# Patient Record
Sex: Female | Born: 1985
Health system: Southern US, Community
[De-identification: ages and names within clinical notes are randomized; demographics above are authoritative.]

## PROBLEM LIST (undated history)

## (undated) DIAGNOSIS — N611 Abscess of the breast and nipple: Secondary | ICD-10-CM

## (undated) DIAGNOSIS — T8332XA Displacement of intrauterine contraceptive device, initial encounter: Secondary | ICD-10-CM

## (undated) DIAGNOSIS — F4541 Pain disorder exclusively related to psychological factors: Secondary | ICD-10-CM

## (undated) DIAGNOSIS — O24419 Gestational diabetes mellitus in pregnancy, unspecified control: Secondary | ICD-10-CM

## (undated) DIAGNOSIS — U071 COVID-19: Secondary | ICD-10-CM

## (undated) HISTORY — DX: COVID-19: U07.1

## (undated) HISTORY — DX: Abscess of the breast and nipple: N61.1

## (undated) HISTORY — PX: NO PAST SURGERIES: SHX2092

## (undated) HISTORY — DX: Gestational diabetes mellitus in pregnancy, unspecified control: O24.419

---
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
## 2004-01-02 ENCOUNTER — Inpatient Hospital Stay (HOSPITAL_COMMUNITY): Admission: AD | Admit: 2004-01-02 | Discharge: 2004-01-04 | Payer: Self-pay | Admitting: Obstetrics

## 2009-02-26 ENCOUNTER — Ambulatory Visit (HOSPITAL_COMMUNITY): Admission: RE | Admit: 2009-02-26 | Discharge: 2009-02-26 | Payer: Self-pay | Admitting: Obstetrics & Gynecology

## 2009-03-12 ENCOUNTER — Ambulatory Visit (HOSPITAL_COMMUNITY): Admission: RE | Admit: 2009-03-12 | Discharge: 2009-03-12 | Payer: Self-pay | Admitting: Obstetrics & Gynecology

## 2009-04-20 ENCOUNTER — Ambulatory Visit: Payer: Self-pay | Admitting: Advanced Practice Midwife

## 2009-04-20 ENCOUNTER — Inpatient Hospital Stay (HOSPITAL_COMMUNITY): Admission: AD | Admit: 2009-04-20 | Discharge: 2009-04-20 | Payer: Self-pay | Admitting: Family Medicine

## 2009-04-20 ENCOUNTER — Encounter: Admission: RE | Admit: 2009-04-20 | Discharge: 2009-04-20 | Payer: Self-pay | Admitting: Obstetrics & Gynecology

## 2009-07-19 ENCOUNTER — Ambulatory Visit: Payer: Self-pay | Admitting: Obstetrics and Gynecology

## 2009-07-24 ENCOUNTER — Inpatient Hospital Stay (HOSPITAL_COMMUNITY): Admission: AD | Admit: 2009-07-24 | Discharge: 2009-07-26 | Payer: Self-pay | Admitting: Obstetrics and Gynecology

## 2009-07-24 ENCOUNTER — Ambulatory Visit: Payer: Self-pay | Admitting: Family Medicine

## 2010-09-04 LAB — RPR: RPR Ser Ql: NONREACTIVE

## 2010-09-04 LAB — CBC
HCT: 35.9 % — ABNORMAL LOW (ref 36.0–46.0)
Hemoglobin: 12.1 g/dL (ref 12.0–15.0)
MCHC: 33.5 g/dL (ref 30.0–36.0)
MCV: 89.1 fL (ref 78.0–100.0)
RBC: 4.03 MIL/uL (ref 3.87–5.11)
WBC: 8.1 10*3/uL (ref 4.0–10.5)

## 2012-12-04 ENCOUNTER — Encounter (HOSPITAL_COMMUNITY): Payer: Self-pay | Admitting: *Deleted

## 2012-12-04 ENCOUNTER — Emergency Department (HOSPITAL_COMMUNITY)
Admission: EM | Admit: 2012-12-04 | Discharge: 2012-12-04 | Disposition: A | Discharge status: Home / Self Care | Payer: Self-pay | Attending: Emergency Medicine | Admitting: Emergency Medicine

## 2012-12-04 DIAGNOSIS — L0291 Cutaneous abscess, unspecified: Secondary | ICD-10-CM

## 2012-12-04 DIAGNOSIS — IMO0002 Reserved for concepts with insufficient information to code with codable children: Secondary | ICD-10-CM | POA: Insufficient documentation

## 2012-12-04 DIAGNOSIS — I96 Gangrene, not elsewhere classified: Secondary | ICD-10-CM

## 2012-12-04 DIAGNOSIS — L988 Other specified disorders of the skin and subcutaneous tissue: Secondary | ICD-10-CM | POA: Insufficient documentation

## 2012-12-04 MED ORDER — SODIUM CHLORIDE 0.9 % IV BOLUS (SEPSIS)
1000.0000 mL | Freq: Once | INTRAVENOUS | Status: AC
  Administered 2012-12-04: 1000 mL via INTRAVENOUS

## 2012-12-04 MED ORDER — HYDROCODONE-ACETAMINOPHEN 5-325 MG PO TABS: 1.0000 | ORAL_TABLET | ORAL | Status: DC | PRN

## 2012-12-04 MED ORDER — CLINDAMYCIN PHOSPHATE 600 MG/50ML IV SOLN
600.0000 mg | Freq: Once | INTRAVENOUS | Status: AC
  Administered 2012-12-04: 600 mg via INTRAVENOUS
  Filled 2012-12-04: qty 50

## 2012-12-04 MED ORDER — ACETAMINOPHEN 500 MG PO TABS
1000.0000 mg | ORAL_TABLET | Freq: Once | ORAL | Status: AC
  Administered 2012-12-04: 1000 mg via ORAL
  Filled 2012-12-04: qty 2

## 2012-12-04 MED ORDER — CLINDAMYCIN HCL 150 MG PO CAPS: 150.0000 mg | ORAL_CAPSULE | Freq: Three times a day (TID) | ORAL | Status: DC

## 2012-12-04 NOTE — ED Provider Notes (Signed)
History     CSN: [id number]  Arrival date & time 12/04/12  1039   First MD Initiated Contact with Patient 12/04/12 1117      Chief Complaint  Patient presents with  . Abscess    (Consider location/radiation/quality/duration/timing/severity/associated sxs/prior treatment) The history is provided by the patient. The history is limited by a language barrier.    Sara Fowler is a 27 year old female.  She presents to the emergency department complaining of right forearm abscess.  Patient is predominantly Spanish speaking but is able to communicate well.  Her daughters provide translation more complicated questions.  Patient has a history of one prior abscess under her breast during pregnancy.  The patient states that she developed a bug bite that was itchy on her own warm.  She scratched in the next day she developed pain and swelling.  This is approximately 8 days ago.  He went to severe pain.  2 days ago the patient squeezed and burst the abscess and had significant pus and bloody drainage.  She denies any fevers, chills, nausea, vomiting, myalgias or arthralgias..  Does not know if she was bitten by anything.  She states that she has another area on her right lower that she is concerned this developing into an abscess.    History reviewed. No pertinent past medical history.  History reviewed. No pertinent past surgical history.  History reviewed. No pertinent family history.  History  Substance Use Topics  . Smoking status: Not on file  . Smokeless tobacco: Not on file  . Alcohol Use: No    OB History   Grav Para Term Preterm Abortions TAB SAB Ect Mult Living                  Review of Systems  Constitutional: Negative for fever and chills.  HENT: Negative for trouble swallowing.   Respiratory: Negative for shortness of breath.   Cardiovascular: Negative for chest pain.  Gastrointestinal: Negative for nausea, vomiting, abdominal pain, diarrhea and constipation.   Genitourinary: Negative for dysuria and hematuria.  Musculoskeletal: Negative for myalgias and arthralgias.  Skin: Positive for wound.  Neurological: Negative for numbness.  All other systems reviewed and are negative.     Allergies  Review of patient's allergies indicates no known allergies.  Home Medications   Current Outpatient Rx  Name  Route  Sig  Dispense  Refill  . acetaminophen (TYLENOL) 325 MG tablet   Oral   Take by mouth every 6 (six) hours as needed for pain.           BP 112/65  Pulse 66  Temp(Src) 98.5 F (36.9 C) (Oral)  Resp 20  SpO2 98%  Physical Exam  Constitutional: She is oriented to person, place, and time. She appears well-developed and well-nourished. No distress.  HENT:  Head: Normocephalic and atraumatic.  Eyes: Conjunctivae are normal. No scleral icterus.  Neck: Normal range of motion.  Cardiovascular: Normal rate, regular rhythm and normal heart sounds.  Exam reveals no gallop and no friction rub.   No murmur heard. Pulmonary/Chest: Effort normal and breath sounds normal. No respiratory distress.  Abdominal: Soft. Bowel sounds are normal. She exhibits no distension and no mass. There is no tenderness. There is no guarding.  Neurological: She is alert and oriented to person, place, and time.  Skin: Skin is warm and dry. She is not diaphoretic.  There is a 2 x 2 centimeter opened draining wound with serous discharge.  There is about 4 x  6 cm of surrounding and duration without lymphangitis.  It appears to be black tissue around the open lesion.  No pus noted.  There is an area of excoriation on the right calf.  There is about 4 cm of tender induration surrounding.new central fluctuance no lymphangitis.    ED Course  Procedures (including critical care time)  Labs Reviewed - No data to display No results found.   1. Abscess and cellulitis   2. Necrosis       MDM  12:07 PM patient with open draining wound.  Am concerned for  possible necrotic tissue surrounding.  I have asked for the patient to be seen by the attending physician.  She does not appear to be in any acute distress.  The signs of systemic infection the      12:36 PM patient seen in shared visit with Dr. Clarene Duke. WIll treat here with IV Clinda. No debridement. D/C with clinda, return to folllow up in 2 days for recheck  Arthor Captain, PA-C 12/04/12 1609

## 2012-12-04 NOTE — ED Notes (Signed)
Reports having abscess to right forearm x 6 days.

## 2012-12-07 NOTE — ED Provider Notes (Signed)
Medical screening examination/treatment/procedure(s) were performed by non-physician practitioner and as supervising physician I was immediately available for consultation/collaboration.   Laray Anger, DO 12/07/12 1816

## 2016-09-06 LAB — GLUCOSE, POCT (MANUAL RESULT ENTRY): POC Glucose: 98 mg/dl (ref 70–99)

## 2016-12-14 DIAGNOSIS — T8332XA Displacement of intrauterine contraceptive device, initial encounter: Secondary | ICD-10-CM

## 2016-12-14 HISTORY — DX: Displacement of intrauterine contraceptive device, initial encounter: T83.32XA

## 2016-12-26 ENCOUNTER — Ambulatory Visit (INDEPENDENT_AMBULATORY_CARE_PROVIDER_SITE_OTHER): Payer: Self-pay | Admitting: Obstetrics and Gynecology

## 2016-12-26 DIAGNOSIS — T8332XA Displacement of intrauterine contraceptive device, initial encounter: Secondary | ICD-10-CM | POA: Insufficient documentation

## 2016-12-26 DIAGNOSIS — T8332XD Displacement of intrauterine contraceptive device, subsequent encounter: Secondary | ICD-10-CM

## 2016-12-26 NOTE — Progress Notes (Signed)
Patient ID: Sara Fowler, female   DOB: 1986/04/06, 31 y.o.   MRN: [id number] [facility]. Pt had Mirena IUD placed back in 2011. Presented to HD in 2017 and IUD strings not seen and unable to locate and remove by NP and MD Was supposed to be scheduled for Dx hysteroscopy and removal but was not. U/S this past May confirmed IUD in proper place Pt is currently taking OCP's d/t expired IUD.   Consent obtained  GU, nl EGBUS, no IUD string noted, single tooth tenaculum placed on ant lip of cervix, Kelly clamped passed through os several times and unable to grasp IUD string. Monsel's solution applied for hemostasis. Pt tolerated well, no complications  A/P Retained IUD  [facility] Risks and post op care reviewed. Interrupter services used during today's visit.

## 2016-12-26 NOTE — Patient Instructions (Signed)
Histeroscopa (Hysteroscopy) La histeroscopa es un procedimiento que se utiliza para observar el interior de la matriz (tero) Puede indicarse por diferentes motivos, entre ellos:  Para evaluar una hemorragia anormal, un fibroma (tumor benigno, no canceroso), tumores, plipos o tejido cicatrizal (adherencias) y la posibilidad de cncer de tero.  Para detectar bultos (tumores) y otros crecimientos anormales uterinos.  Para buscar las causas por las que una mujer no queda embarazada (infertilidad) causas recurrentes de prdida de embarazo (abortos espontneos) o por la prdida de un dispositivo intrauterino (DIU).  Para realizar un procedimiento de esterilizacin cerrando las trompas de Falopio desde adentro del tero. En este procedimiento, se coloca un tubo delgado y luminoso, con una cmara en el extremo (histeroscopio)para observar el interior del tero. La histeroscopa se realiza inmediatamente despus del perodo menstrual para asegurarse de que no existe embarazo. INFORME A SU MDICO:  Cualquier alergia que tenga.  Todos los medicamentos que utiliza, incluyendo vitaminas, hierbas, gotas oftlmicas, cremas y medicamentos de venta libre.  Problemas previos que usted o los miembros de su familia hayan tenido con el uso de anestsicos.  Enfermedades de la sangre.   Cirugas previas.  Padecimientos mdicos.  RIESGOS Y COMPLICACIONES Generalmente es un procedimiento seguro. Sin embargo, como en cualquier procedimiento, pueden surgir complicaciones. Las complicaciones posibles son:  Perforacin del tero.  Sangrado excesivo.  Infeccin.  Lesin en el cuello del tero.  Lesiones en otros rganos.  Reaccin alrgica a un medicamento.  Inoculacin de lquido en exceso dentro del tero. ANTES DEL PROCEDIMIENTO  Consulte a su mdico si debe cambiar o suspender los medicamentos que toma habitualmente.  No tome aspirina ni anticoagulantes durante la semana previa al  procedimiento, o segn le hayan indicado. Pueden ocasionar hemorragias.  Si fuma, no lo haga durante las dos semanas anteriores al procedimiento.  En algunos casos, el da anterior al procedimiento se coloca un medicamento en el cuello del tero. Este medicamento dilata el cuello y agranda la abertura. Esto facilita la insercin del instrumento en el tero durante el procedimiento.  No debe comer ni beber nada durante al menos 8 horas antes de la ciruga.  Pdale a alguna persona que la lleve a su casa luego del procedimiento.  PROCEDIMIENTO  Le administrarn un medicamento para relajarse (sedante). Tambin podrn administrarle uno de los siguientes medicamentos: ? Medicamentos que adormecen el rea del cuello uterino (anestesia local). ? Un medicamento para que duerma durante el procedimiento (anestesia genera).  El histeroscopio se inserta a travs de la vagina, dentro del tero. La cmara del laparoscopio enva una imagen a una pantalla de televisin que se encuentra en el quirfano. De este modo, el mdico tendr una buena visin del interior del tero.  Durante el procedimiento le colocarn un lquido o aire en el interior de tero, lo que le permitir al cirujano observar mejor.  En algunas ocasiones, el tejido del interior del tero se legra suavemente. Esas muestras de tejido se envan al laboratorio para ser examinadas.  DESPUS DEL PROCEDIMIENTO  Si le han administrado anestesia general podr sentirse atontada durante algunas horas despus del procedimiento.  Si se utiliza un anestsico local, podr volver a su casa tan pronto como est estable y se sienta en condiciones.  Podr sentir clicos leves. Es normal que esto dure un par de das.  Podr tener una hemorragia, la que puede variar entre una pequea mancha durante algunos das hasta una hemorragia similar a una menstruacin durante 3-7 das. Esto es normal.  Si el resultado   de los estudios no estn listos durante la  visita, tenga otra entrevista con su mdico para conocerlos.  Esta informacin no tiene como fin reemplazar el consejo del mdico. Asegrese de hacerle al mdico cualquier pregunta que tenga. Document Released: 06/02/2005 Document Revised: 03/23/2013 Document Reviewed: 12/30/2012 Elsevier Interactive Patient Education  2017 Elsevier Inc.  

## 2016-12-30 ENCOUNTER — Encounter (HOSPITAL_COMMUNITY): Payer: Self-pay

## 2017-01-12 ENCOUNTER — Encounter (HOSPITAL_BASED_OUTPATIENT_CLINIC_OR_DEPARTMENT_OTHER): Payer: Self-pay | Admitting: *Deleted

## 2017-01-13 ENCOUNTER — Encounter (HOSPITAL_BASED_OUTPATIENT_CLINIC_OR_DEPARTMENT_OTHER)
Admission: RE | Admit: 2017-01-13 | Discharge: 2017-01-13 | Disposition: A | Discharge status: Home / Self Care | Payer: Self-pay | Source: Ambulatory Visit | Attending: Obstetrics and Gynecology | Admitting: Obstetrics and Gynecology

## 2017-01-13 DIAGNOSIS — Y848 Other medical procedures as the cause of abnormal reaction of the patient, or of later complication, without mention of misadventure at the time of the procedure: Secondary | ICD-10-CM | POA: Insufficient documentation

## 2017-01-13 DIAGNOSIS — T8389XA Other specified complication of genitourinary prosthetic devices, implants and grafts, initial encounter: Secondary | ICD-10-CM | POA: Insufficient documentation

## 2017-01-13 DIAGNOSIS — Z01818 Encounter for other preprocedural examination: Secondary | ICD-10-CM | POA: Insufficient documentation

## 2017-01-13 LAB — POCT PREGNANCY, URINE: PREG TEST UR: NEGATIVE

## 2017-01-13 LAB — CBC
HCT: 38.5 % (ref 36.0–46.0)
Hemoglobin: 12.9 g/dL (ref 12.0–15.0)
MCH: 29.8 pg (ref 26.0–34.0)
MCHC: 33.5 g/dL (ref 30.0–36.0)
MCV: 88.9 fL (ref 78.0–100.0)
Platelets: 264 10*3/uL (ref 150–400)
RBC: 4.33 MIL/uL (ref 3.87–5.11)
RDW: 13.2 % (ref 11.5–15.5)
WBC: 5.9 10*3/uL (ref 4.0–10.5)

## 2017-01-13 NOTE — Pre-Procedure Instructions (Signed)
Dorita will be interpreter for pt., per Judy at [facility]; please call [phone] if surgery time changes. 

## 2017-01-13 NOTE — H&P (Signed)
Sara Fowler is an 31 y.o.G3P3 female who presents for removal of IUD. She had a Mirena placed in 2011. Has had several attempts in the office to remove due to being expired but unable to do so. GYN U/S has confirmed IUD in proper place. Currently taking OCP's for contraception.   Menstrual History: Menarche age: [age] No LMP recorded. Patient is not currently having periods (Reason: IUD).    Past Medical History:  Diagnosis Date  . Malpositioned IUD 12/2016  . Stress headaches     Past Surgical History:  Procedure Laterality Date  . NO PAST SURGERIES      History reviewed. No pertinent family history.  Social History:  reports that she has never smoked. She has never used smokeless tobacco. She reports that she drinks alcohol. She reports that she does not use drugs.  Allergies: No Known Allergies  No prescriptions prior to admission.    Review of Systems  Constitutional: Negative.   Respiratory: Negative.   Cardiovascular: Negative.   Gastrointestinal: Negative.   Genitourinary: Negative.     Height 5\' 4"  (1.626 m), weight 217 lb (98.4 kg). Physical Exam  Constitutional: She appears well-developed and well-nourished.  Cardiovascular: Normal rate, regular rhythm and normal heart sounds.   Respiratory: Effort normal and breath sounds normal.  GI: Soft. Bowel sounds are normal.  Genitourinary:  Genitourinary Comments: Nl EGBUS, cervix no lesions, unable to visualize IUD strings, uterus small mobile no masses    Results for orders placed or performed during the hospital encounter of 01/13/17 (from the past 24 hour(s))  Pregnancy, urine POC     Status: None   Collection Time: 01/13/17 12:36 PM  Result Value Ref Range   Preg Test, Ur NEGATIVE NEGATIVE    No results found.  Assessment/Plan: Retained IUD  Pt is being admitted today for hysteroscopic removal of IUD. R/B and post op care have been reviewed.    Hermina StaggersMichael L Stanislaus Kaltenbach 01/13/2017, 2:28 PM

## 2017-01-13 NOTE — Progress Notes (Signed)
Pregnancy test negative

## 2017-01-14 ENCOUNTER — Ambulatory Visit (HOSPITAL_BASED_OUTPATIENT_CLINIC_OR_DEPARTMENT_OTHER): Payer: Self-pay | Admitting: Anesthesiology

## 2017-01-14 ENCOUNTER — Encounter (HOSPITAL_BASED_OUTPATIENT_CLINIC_OR_DEPARTMENT_OTHER)
Admission: RE | Disposition: A | Discharge status: Home / Self Care | Payer: Self-pay | Source: Ambulatory Visit | Attending: Obstetrics and Gynecology

## 2017-01-14 ENCOUNTER — Encounter (HOSPITAL_BASED_OUTPATIENT_CLINIC_OR_DEPARTMENT_OTHER): Payer: Self-pay | Admitting: *Deleted

## 2017-01-14 ENCOUNTER — Ambulatory Visit (HOSPITAL_BASED_OUTPATIENT_CLINIC_OR_DEPARTMENT_OTHER)
Admission: RE | Admit: 2017-01-14 | Discharge: 2017-01-14 | Disposition: A | Discharge status: Home / Self Care | Payer: Self-pay | Source: Ambulatory Visit | Attending: Obstetrics and Gynecology | Admitting: Obstetrics and Gynecology

## 2017-01-14 DIAGNOSIS — T8332XD Displacement of intrauterine contraceptive device, subsequent encounter: Secondary | ICD-10-CM

## 2017-01-14 DIAGNOSIS — Y762 Prosthetic and other implants, materials and accessory obstetric and gynecological devices associated with adverse incidents: Secondary | ICD-10-CM | POA: Insufficient documentation

## 2017-01-14 DIAGNOSIS — T8332XA Displacement of intrauterine contraceptive device, initial encounter: Secondary | ICD-10-CM | POA: Insufficient documentation

## 2017-01-14 DIAGNOSIS — Z30432 Encounter for removal of intrauterine contraceptive device: Secondary | ICD-10-CM

## 2017-01-14 DIAGNOSIS — N840 Polyp of corpus uteri: Secondary | ICD-10-CM | POA: Insufficient documentation

## 2017-01-14 HISTORY — DX: Displacement of intrauterine contraceptive device, initial encounter: T83.32XA

## 2017-01-14 HISTORY — DX: Pain disorder exclusively related to psychological factors: F45.41

## 2017-01-14 HISTORY — PX: HYSTEROSCOPY W/D&C: SHX1775

## 2017-01-14 SURGERY — DILATATION AND CURETTAGE /HYSTEROSCOPY
Anesthesia: General | Site: Vagina

## 2017-01-14 MED ORDER — SOD CITRATE-CITRIC ACID 500-334 MG/5ML PO SOLN
30.0000 mL | ORAL | Status: AC
  Administered 2017-01-14: 30 mL via ORAL

## 2017-01-14 MED ORDER — LACTATED RINGERS IV SOLN: INTRAVENOUS | Status: DC

## 2017-01-14 MED ORDER — DOXYCYCLINE HYCLATE 100 MG IV SOLR
100.0000 mg | INTRAVENOUS | Status: AC
  Administered 2017-01-14: 100 mg via INTRAVENOUS
  Filled 2017-01-14: qty 100

## 2017-01-14 MED ORDER — LIDOCAINE 2% (20 MG/ML) 5 ML SYRINGE
INTRAMUSCULAR | Status: DC | PRN
  Administered 2017-01-14: 60 mg via INTRAVENOUS

## 2017-01-14 MED ORDER — MIDAZOLAM HCL 2 MG/2ML IJ SOLN
1.0000 mg | INTRAMUSCULAR | Status: DC | PRN
  Administered 2017-01-14: 2 mg via INTRAVENOUS

## 2017-01-14 MED ORDER — KETOROLAC TROMETHAMINE 30 MG/ML IJ SOLN
INTRAMUSCULAR | Status: AC
  Filled 2017-01-14: qty 1

## 2017-01-14 MED ORDER — DEXAMETHASONE SODIUM PHOSPHATE 10 MG/ML IJ SOLN
INTRAMUSCULAR | Status: AC
  Filled 2017-01-14: qty 1

## 2017-01-14 MED ORDER — FERRIC SUBSULFATE 259 MG/GM EX SOLN
CUTANEOUS | Status: DC | PRN
  Administered 2017-01-14: 1 via TOPICAL

## 2017-01-14 MED ORDER — KETOROLAC TROMETHAMINE 30 MG/ML IJ SOLN
INTRAMUSCULAR | Status: DC | PRN
  Administered 2017-01-14: 30 mg via INTRAVENOUS

## 2017-01-14 MED ORDER — HYDROCODONE-ACETAMINOPHEN 5-325 MG PO TABS: 1.0000 | ORAL_TABLET | Freq: Four times a day (QID) | ORAL | 0 refills | Status: DC | PRN

## 2017-01-14 MED ORDER — DEXAMETHASONE SODIUM PHOSPHATE 4 MG/ML IJ SOLN
INTRAMUSCULAR | Status: DC | PRN
  Administered 2017-01-14: 10 mg via INTRAVENOUS

## 2017-01-14 MED ORDER — MIDAZOLAM HCL 2 MG/2ML IJ SOLN
INTRAMUSCULAR | Status: AC
  Filled 2017-01-14: qty 2

## 2017-01-14 MED ORDER — FENTANYL CITRATE (PF) 100 MCG/2ML IJ SOLN
INTRAMUSCULAR | Status: AC
  Filled 2017-01-14: qty 2

## 2017-01-14 MED ORDER — PROPOFOL 10 MG/ML IV BOLUS
INTRAVENOUS | Status: DC | PRN
  Administered 2017-01-14: 200 mg via INTRAVENOUS

## 2017-01-14 MED ORDER — PROPOFOL 10 MG/ML IV BOLUS
INTRAVENOUS | Status: AC
  Filled 2017-01-14: qty 20

## 2017-01-14 MED ORDER — BUPIVACAINE HCL 0.5 % IJ SOLN
INTRAMUSCULAR | Status: DC | PRN
  Administered 2017-01-14: 10 mL

## 2017-01-14 MED ORDER — LACTATED RINGERS IV SOLN
INTRAVENOUS | Status: DC
  Administered 2017-01-14: 13:00:00 via INTRAVENOUS

## 2017-01-14 MED ORDER — SCOPOLAMINE 1 MG/3DAYS TD PT72: 1.0000 | MEDICATED_PATCH | Freq: Once | TRANSDERMAL | Status: DC | PRN

## 2017-01-14 MED ORDER — ONDANSETRON HCL 4 MG/2ML IJ SOLN
INTRAMUSCULAR | Status: AC
  Filled 2017-01-14: qty 2

## 2017-01-14 MED ORDER — FERRIC SUBSULFATE 259 MG/GM EX SOLN
CUTANEOUS | Status: AC
  Filled 2017-01-14: qty 8

## 2017-01-14 MED ORDER — SOD CITRATE-CITRIC ACID 500-334 MG/5ML PO SOLN
ORAL | Status: AC
  Filled 2017-01-14: qty 15

## 2017-01-14 MED ORDER — LIDOCAINE 2% (20 MG/ML) 5 ML SYRINGE
INTRAMUSCULAR | Status: AC
  Filled 2017-01-14: qty 5

## 2017-01-14 MED ORDER — IBUPROFEN 800 MG PO TABS: 800.0000 mg | ORAL_TABLET | Freq: Three times a day (TID) | ORAL | 0 refills | Status: DC | PRN

## 2017-01-14 MED ORDER — HYDROMORPHONE HCL 1 MG/ML IJ SOLN: 0.2500 mg | INTRAMUSCULAR | Status: DC | PRN

## 2017-01-14 MED ORDER — FENTANYL CITRATE (PF) 100 MCG/2ML IJ SOLN
50.0000 ug | INTRAMUSCULAR | Status: DC | PRN
  Administered 2017-01-14: 100 ug via INTRAVENOUS

## 2017-01-14 SURGICAL SUPPLY — 16 items
CANISTER SUCT 3000ML PPV (MISCELLANEOUS) ×3 IMPLANT
CATH ROBINSON RED A/P 16FR (CATHETERS) ×3 IMPLANT
CLOTH BEACON ORANGE TIMEOUT ST (SAFETY) ×3 IMPLANT
CONTAINER PREFILL 10% NBF 60ML (FORM) ×6 IMPLANT
ELECT REM PT RETURN 9FT ADLT (ELECTROSURGICAL)
ELECTRODE REM PT RTRN 9FT ADLT (ELECTROSURGICAL) IMPLANT
GLOVE BIO SURGEON STRL SZ7.5 (GLOVE) ×3 IMPLANT
GLOVE BIOGEL PI IND STRL 7.0 (GLOVE) ×1 IMPLANT
GLOVE BIOGEL PI INDICATOR 7.0 (GLOVE) ×2
GOWN STRL REUS W/TWL LRG LVL3 (GOWN DISPOSABLE) ×3 IMPLANT
GOWN STRL REUS W/TWL XL LVL3 (GOWN DISPOSABLE) ×3 IMPLANT
PACK VAGINAL MINOR WOMEN LF (CUSTOM PROCEDURE TRAY) ×3 IMPLANT
PAD OB MATERNITY 4.3X12.25 (PERSONAL CARE ITEMS) ×3 IMPLANT
TOWEL OR 17X24 6PK STRL BLUE (TOWEL DISPOSABLE) ×6 IMPLANT
TUBING AQUILEX INFLOW (TUBING) ×3 IMPLANT
TUBING AQUILEX OUTFLOW (TUBING) ×3 IMPLANT

## 2017-01-14 NOTE — Interval H&P Note (Signed)
History and Physical Interval Note:  01/14/2017 12:32 PM  Sara Fowler  has presented today for surgery, with the diagnosis of MALPOSITIONED IUD  The various methods of treatment have been discussed with the patient and family. After consideration of risks, benefits and other options for treatment, the patient has consented to  Procedure(s): DILATATION AND CURETTAGE /HYSTEROSCOPY W/ IUD REMOVAL (N/A) as a surgical intervention .  The patient's history has been reviewed, patient examined, no change in status, stable for surgery.  I have reviewed the patient's chart and labs.  Questions were answered to the patient's satisfaction.     Hermina StaggersMichael L Ansley Mangiapane

## 2017-01-14 NOTE — Transfer of Care (Signed)
Immediate Anesthesia Transfer of Care Note  Patient: Sara Fowler  Procedure(s) Performed: Procedure(s): DILATATION AND CURETTAGE /HYSTEROSCOPY W/ IUD REMOVAL (N/A)  Patient Location: PACU  Anesthesia Type:General  Level of Consciousness: awake  Airway & Oxygen Therapy: Patient Spontanous Breathing and Patient connected to face mask oxygen  Post-op Assessment: Report given to RN and Post -op Vital signs reviewed and stable  Post vital signs: Reviewed and stable  Last Vitals:  Vitals:   01/14/17 1223  BP: 109/85  Pulse: 71  Resp: 18  Temp: 36.8 C    Last Pain:  Vitals:   01/14/17 1223  TempSrc: Oral  PainSc: 0-No pain      Patients Stated Pain Goal: 0 (01/14/17 1223)  Complications: No apparent anesthesia complications

## 2017-01-14 NOTE — Anesthesia Postprocedure Evaluation (Signed)
Anesthesia Post Note  Patient: Sara Fowler  Procedure(s) Performed: Procedure(s) (LRB): DILATATION AND CURETTAGE /HYSTEROSCOPY W/ IUD REMOVAL (N/A)     Patient location during evaluation: PACU Anesthesia Type: General Level of consciousness: awake and alert Pain management: pain level controlled Vital Signs Assessment: post-procedure vital signs reviewed and stable Respiratory status: spontaneous breathing, nonlabored ventilation and respiratory function stable Cardiovascular status: blood pressure returned to baseline and stable Postop Assessment: no signs of nausea or vomiting Anesthetic complications: no    Last Vitals:  Vitals:   01/14/17 1415 01/14/17 1435  BP: 111/79 119/77  Pulse: 60 (!) 57  Resp: 15 16  Temp:  36.8 C    Last Pain:  Vitals:   01/14/17 1435  TempSrc:   PainSc: 0-No pain                 Brea Coleson,W. EDMOND

## 2017-01-14 NOTE — Op Note (Signed)
Sara Fowler PROCEDURE DATE: 01/14/2017  PREOPERATIVE DIAGNOSIS:  Retained IUD POSTOPERATIVE DIAGNOSIS: The same PROCEDURE:     Dilation and Curettage and removal of IUD SURGEON:  Dr. Casimiro NeedleMichael L. Joni Norrod  INDICATIONS: 31 y.o. female with retained IUD. Unable to remove if office.  Risks of surgery were discussed with the patient including but not limited to: bleeding which may require transfusion; infection which may require antibiotics; injury to uterus or surrounding organs; need for additional procedures including laparotomy or laparoscopy; possibility of intrauterine scarring which may impair future fertility; and other postoperative/anesthesia complications. Written informed consent was obtained.    FINDINGS:  Normal appearing cervix, IUD noted to be sideways in endometrial cavity  ANESTHESIA:    Monitored intravenous sedation, paracervical block. INTRAVENOUS FLUIDS:  600 ml of LR ESTIMATED BLOOD LOSS:  Less than 20 ml. SPECIMENS:  Endometrial curettings COMPLICATIONS:  None immediate.  PROCEDURE DETAILS:  The patient received intravenous Doxycycline while in the preoperative area.  She was then taken to the operating room where monitored intravenous sedation was administered and was found to be adequate.  After an adequate timeout was performed, she was placed in the dorsal lithotomy position and examined; then prepped and draped in the sterile manner.   Her bladder was catheterized for an unmeasured amount of clear, yellow urine. A vaginal speculum was then placed in the patient's vagina and a single tooth tenaculum was applied to the anterior lip of the cervix.  A paracervical block using 10 ml of 0.5% Marcaine was administered. The cervix was gently dilated and polyp forceps was introduced into the endometrial cavity. The IUD was grasped and removed. Curettage was then performed.  There was minimal bleeding noted and the tenaculum was removed. Monsel's was applied to the cervix,with good  hemostasis noted.   All instruments were removed from the patient's vagina.  Sponge and instrument counts were correct times two  The patient tolerated the procedure well and was taken to the recovery area awake, and in stable condition.  The patient will be discharged to home as per PACU criteria.  Routine postoperative instructions given.  She was prescribed Percocet and Ibuprofen.  She will follow up in the clinic in 4 weeks for postoperative evaluation.   Latanga Nedrow L. Alysia PennaErvin, MD, FACOG Attending Obstetrician & Gynecologist Faculty Practice, [facility]

## 2017-01-14 NOTE — Anesthesia Preprocedure Evaluation (Addendum)
Anesthesia Evaluation  Patient identified by MRN, date of birth, ID band Patient awake    Reviewed: Allergy & Precautions, H&P , NPO status , Patient's Chart, lab work & pertinent test results  Airway Mallampati: III  TM Distance: >3 FB Neck ROM: Full    Dental no notable dental hx. (+) Teeth Intact, Dental Advisory Given   Pulmonary neg pulmonary ROS,    Pulmonary exam normal breath sounds clear to auscultation       Cardiovascular negative cardio ROS   Rhythm:Regular Rate:Normal     Neuro/Psych negative neurological ROS  negative psych ROS   GI/Hepatic negative GI ROS, Neg liver ROS,   Endo/Other  negative endocrine ROS  Renal/GU negative Renal ROS  negative genitourinary   Musculoskeletal   Abdominal   Peds  Hematology negative hematology ROS (+)   Anesthesia Other Findings   Reproductive/Obstetrics negative OB ROS                            Anesthesia Physical Anesthesia Plan  ASA: II  Anesthesia Plan: General   Post-op Pain Management:    Induction: Intravenous  PONV Risk Score and Plan: 3 and Ondansetron, Dexamethasone and Midazolam  Airway Management Planned: LMA  Additional Equipment:   Intra-op Plan:   Post-operative Plan: Extubation in OR  Informed Consent: I have reviewed the patients History and Physical, chart, labs and discussed the procedure including the risks, benefits and alternatives for the proposed anesthesia with the patient or authorized representative who has indicated his/her understanding and acceptance.     Dental advisory given  Plan Discussed with: CRNA  Anesthesia Plan Comments:         Anesthesia Quick Evaluation  

## 2017-01-14 NOTE — Discharge Instructions (Signed)
No Ibuprofen products until after 7pm today.  Post Anesthesia Home Care Instructions  Activity: Get plenty of rest for the remainder of the day. A responsible individual must stay with you for 24 hours following the procedure.  For the next 24 hours, DO NOT: -Drive a car -Advertising copywriterperate machinery -Drink alcoholic beverages -Take any medication unless instructed by your physician -Make any legal decisions or sign important papers.  Meals: Start with liquid foods such as gelatin or soup. Progress to regular foods as tolerated. Avoid greasy, spicy, heavy foods. If nausea and/or vomiting occur, drink only clear liquids until the nausea and/or vomiting subsides. Call your physician if vomiting continues.  Special Instructions/Symptoms: Your throat may feel dry or sore from the anesthesia or the breathing tube placed in your throat during surgery. If this causes discomfort, gargle with warm salt water. The discomfort should disappear within 24 hours.  If you had a scopolamine patch placed behind your ear for the management of post- operative nausea and/or vomiting:  1. The medication in the patch is effective for 72 hours, after which it should be removed.  Wrap patch in a tissue and discard in the trash. Wash hands thoroughly with soap and water. 2. You may remove the patch earlier than 72 hours if you experience unpleasant side effects which may include dry mouth, dizziness or visual disturbances. 3. Avoid touching the patch. Wash your hands with soap and water after contact with the patch.

## 2017-01-14 NOTE — Anesthesia Procedure Notes (Signed)
Procedure Name: LMA Insertion Date/Time: 01/14/2017 12:48 PM Performed by: Caren MacadamARTER, Javin Nong W Pre-anesthesia Checklist: Patient identified, Emergency Drugs available, Suction available and Patient being monitored Patient Re-evaluated:Patient Re-evaluated prior to induction Oxygen Delivery Method: Circle system utilized Preoxygenation: Pre-oxygenation with 100% oxygen Induction Type: IV induction Ventilation: Mask ventilation without difficulty LMA: LMA inserted LMA Size: 4.0 Number of attempts: 1 Airway Equipment and Method: Bite block Placement Confirmation: positive ETCO2 and breath sounds checked- equal and bilateral Tube secured with: Tape Dental Injury: Teeth and Oropharynx as per pre-operative assessment

## 2017-01-15 ENCOUNTER — Encounter (HOSPITAL_BASED_OUTPATIENT_CLINIC_OR_DEPARTMENT_OTHER): Payer: Self-pay | Admitting: Obstetrics and Gynecology

## 2017-02-17 ENCOUNTER — Ambulatory Visit: Payer: Self-pay | Admitting: Obstetrics and Gynecology

## 2017-02-17 ENCOUNTER — Encounter: Payer: Self-pay | Admitting: *Deleted

## 2017-02-17 NOTE — Progress Notes (Signed)
Sara Fowler did not keep her scheduled appointment for postop. Per discussion with Dr. Alysia PennaErvin- do not need to call the patient.  May reschedule if she calls.

## 2017-02-23 ENCOUNTER — Ambulatory Visit (INDEPENDENT_AMBULATORY_CARE_PROVIDER_SITE_OTHER): Payer: Self-pay | Admitting: Obstetrics and Gynecology

## 2017-02-23 ENCOUNTER — Encounter: Payer: Self-pay | Admitting: Obstetrics and Gynecology

## 2017-02-23 DIAGNOSIS — Z9889 Other specified postprocedural states: Secondary | ICD-10-CM | POA: Insufficient documentation

## 2017-02-23 NOTE — Progress Notes (Signed)
Pt here for post op follow up after removal of IUD. Doing well. No bowel or bladder dysfunction Has had a cycle since surgery Doing well with OCP's [facility]  PE AF VSS Lungs clear Heart RRR Abd soft + BS   A/P Post op follow up  Doing well. Continue with ADL's and OCP's. Continue GYN care with [facility] F/u with us PRN

## 2020-05-14 ENCOUNTER — Emergency Department (HOSPITAL_COMMUNITY)
Admission: EM | Admit: 2020-05-14 | Discharge: 2020-05-14 | Disposition: A | Discharge status: Home / Self Care | Payer: HRSA Program | Attending: Emergency Medicine | Admitting: Emergency Medicine

## 2020-05-14 ENCOUNTER — Encounter (HOSPITAL_COMMUNITY): Payer: Self-pay

## 2020-05-14 ENCOUNTER — Other Ambulatory Visit: Payer: Self-pay

## 2020-05-14 DIAGNOSIS — O98512 Other viral diseases complicating pregnancy, second trimester: Secondary | ICD-10-CM | POA: Diagnosis not present

## 2020-05-14 DIAGNOSIS — U071 COVID-19: Secondary | ICD-10-CM | POA: Diagnosis not present

## 2020-05-14 DIAGNOSIS — Z3A00 Weeks of gestation of pregnancy not specified: Secondary | ICD-10-CM | POA: Insufficient documentation

## 2020-05-14 DIAGNOSIS — Z3490 Encounter for supervision of normal pregnancy, unspecified, unspecified trimester: Secondary | ICD-10-CM

## 2020-05-14 LAB — RESP PANEL BY RT-PCR (FLU A&B, COVID) ARPGX2
Influenza A by PCR: NEGATIVE
Influenza B by PCR: NEGATIVE
SARS Coronavirus 2 by RT PCR: POSITIVE — AB

## 2020-05-14 NOTE — ED Triage Notes (Signed)
Spanish interpreter used for triage:  Pt reports 3 days of headache, body aches and fever. Fever as high as 100 at home. States she is 7 months pregnant.

## 2020-05-14 NOTE — ED Provider Notes (Signed)
[facility] EMERGENCY DEPARTMENT Provider Note   CSN: [id number] Arrival date & time: 05/14/20  1011     History Chief Complaint  Patient presents with  . Generalized Body Aches  . Fever    Sara Fowler is a 34 y.o. female who is currently 7 months pregnant (G4P3) to the ED today complaining of gradual onset, constant, diffuse, body aches for the past 3 days.  She also reports a fever with T-max 100.0 at home as well as headache.  She denies any recent sick contacts however is unvaccinated against COVID-19.  She states that she has been taking 325 mg Tylenol at home without relief prompting her to come to the ED today.  She states that no one else in her house is having symptoms.  She denies any cough, chest pain, shortness of breath, abdominal pain, nausea, vomiting, urinary symptoms, vaginal bleeding, vaginal discharge.   The history is provided by the patient and medical records. The history is limited by a language barrier. A language interpreter was used.       Past Medical History:  Diagnosis Date  . Malpositioned IUD 12/2016  . Stress headaches     Patient Active Problem List   Diagnosis Date Noted  . Post-operative state 02/23/2017    Past Surgical History:  Procedure Laterality Date  . HYSTEROSCOPY WITH D & C N/A 01/14/2017   Procedure: DILATATION AND CURETTAGE /HYSTEROSCOPY W/ IUD REMOVAL;  Surgeon: Hermina Staggers, MD;  Location: [facility];  Service: Gynecology;  Laterality: N/A;  . NO PAST SURGERIES       OB History    Gravida  1   Para      Term      Preterm      AB      Living        SAB      TAB      Ectopic      Multiple      Live Births              No family history on file.  Social History   Tobacco Use  . Smoking status: Never Smoker  . Smokeless tobacco: Never Used  Vaping Use  . Vaping Use: Never used  Substance Use Topics  . Alcohol use: Yes    Comment: occasionally  .  Drug use: No    Home Medications Prior to Admission medications   Medication Sig Start Date End Date Taking? Authorizing Provider  HYDROcodone-acetaminophen (NORCO/VICODIN) 5-325 MG tablet Take 1-2 tablets by mouth every 6 (six) hours as needed for moderate pain. Patient not taking: Reported on 02/23/2017 01/14/17   Hermina Staggers, MD  ibuprofen (ADVIL,MOTRIN) 800 MG tablet Take 1 tablet (800 mg total) by mouth every 8 (eight) hours as needed. Patient not taking: Reported on 02/23/2017 01/14/17   Hermina Staggers, MD  norethindrone (MICRONOR,CAMILA,ERRIN) 0.35 MG tablet Take 1 tablet by mouth daily.    [provider]    Allergies    Patient has no known allergies.  Review of Systems   Review of Systems  Constitutional: Positive for chills, fatigue and fever.  Respiratory: Negative for cough and shortness of breath.   Cardiovascular: Negative for chest pain.  Gastrointestinal: Negative for abdominal pain, nausea and vomiting.  Genitourinary: Negative for difficulty urinating, dysuria, flank pain and frequency.  Musculoskeletal: Positive for myalgias.  All other systems reviewed and are negative.   Physical Exam Updated Vital Signs  BP 103/77 (BP Location: Right Arm)   Pulse 95   Temp 98.2 F (36.8 C) (Oral)   Resp 18   Ht (P) 5\' 4"  (1.626 m)   Wt (P) 95.3 kg   SpO2 100%   BMI (P) 36.05 kg/m   Physical Exam Vitals and nursing note reviewed.  Constitutional:      General: She is not in acute distress.    Appearance: She is not ill-appearing or diaphoretic.  HENT:     Head: Normocephalic and atraumatic.  Eyes:     Conjunctiva/sclera: Conjunctivae normal.  Cardiovascular:     Rate and Rhythm: Normal rate and regular rhythm.     Pulses: Normal pulses.  Pulmonary:     Effort: Pulmonary effort is normal.     Breath sounds: Normal breath sounds. No wheezing, rhonchi or rales.     Comments: Speaking in full sentences without difficulty. Satting 100% on RA. LCTAB.    Abdominal:     Tenderness: There is no abdominal tenderness. There is no guarding or rebound.  Lymphadenopathy:     Cervical: No cervical adenopathy.  Skin:    General: Skin is warm and dry.     Coloration: Skin is not jaundiced.  Neurological:     Mental Status: She is alert.     ED Results / Procedures / Treatments   Labs (all labs ordered are listed, but only abnormal results are displayed) Labs Reviewed  RESP PANEL BY RT-PCR (FLU A&B, COVID) ARPGX2 - Abnormal; Notable for the following components:      Result Value   SARS Coronavirus 2 by RT PCR POSITIVE (*)    All other components within normal limits    EKG None  Radiology No results found.  Procedures Procedures (including critical care time)  Medications Ordered in ED Medications - No data to display  ED Course  I have reviewed the triage vital signs and the nursing notes.  Pertinent labs & imaging results that were available during my care of the patient were reviewed by me and considered in my medical decision making (see chart for details).  Clinical Course as of May 14 1328  Mon May 14, 2020  1233 SARS Coronavirus 2 by RT PCR(!): POSITIVE [MV]    Clinical Course User Index [MV] May 16, 2020, PA-C   MDM Rules/Calculators/A&P                          34 year old female who is currently 7 months pregnant, G4, P3, who presents to the ED today with complaint of Covid-like symptoms including headache, fevers with T-max 100.0, body aches.  She is unvaccinated.  Denies any recent sick contacts.  Has been taking 325 mg Tylenol without much relief.  On arrival to the ED vitals are stable.  Patient is afebrile, nontachycardic nontachypneic peers to be in no acute distress.  She is able to speak in full sentences without difficulty and satting 100% on room air.  Her lungs are clear to auscultation bilaterally.  She denies any abdominal pain, vomiting, urinary symptoms, vaginal bleeding, vaginal discharge.  No  chest pain or shortness of breath.  Covid test has been collected however still in process.  I did have a lengthy discussion using the interpretive services regarding monoclonal antibody infusion today given she is pregnant and qualifies however patient would like a couple of days to think about it.  I do feel she would benefit from this given she is pregnant  and obese.   Covid test has returned positive.  Patient instructed to self isolate for the next 14 days.  I will give her information to the infusion center for follow-up.  She does want a couple of days to think about it and is understanding that she has up until day 10 to receive the infusion.  She has been instructed on strict return precautions including worsening symptoms of shortness of breath, severe chest pain, abdominal pain, any urinary symptoms, any vaginal symptoms.  Patient is in agreement with plan is stable for discharge home.   This note was prepared using Dragon voice recognition software and may include unintentional dictation errors due to the inherent limitations of voice recognition software.  Final Clinical Impression(s) / ED Diagnoses Final diagnoses:  COVID-19  Pregnancy, unspecified gestational age    Rx / DC Orders ED Discharge Orders    None       Discharge Instructions     You have tested POSITIVE for COVID 19 today. You do qualify for monoclonal antibodies given you are currently pregnant. I have provided the Infusion Center with your information and they will call you to follow up if you are interested.   You will need to self isolate for 14 days starting today. Cleared: 12/14  Please call your OBGYN and let them know that you are currently COVID positive. Please follow up with them after you are done with your quarantine period  Return to the ED IMMEDIATELY for any worsening symptoms including chest pain, shortness of breath, coughing up blood, passing out, abdominal pain, vaginal bleeding/discharge, any  urinary symptoms, or any other new/concerning symptoms       Tanda Rockers, PA-C 05/14/20 1330    Alvira Monday, MD 05/18/20 [phone]

## 2020-05-14 NOTE — Discharge Instructions (Addendum)
You have tested POSITIVE for COVID 19 today. You do qualify for monoclonal antibodies given you are currently pregnant. I have provided the Infusion Center with your information and they will call you to follow up if you are interested.   You will need to self isolate for 14 days starting today. Cleared: 12/14  Please call your OBGYN and let them know that you are currently COVID positive. Please follow up with them after you are done with your quarantine period  Return to the ED IMMEDIATELY for any worsening symptoms including chest pain, shortness of breath, coughing up blood, passing out, abdominal pain, vaginal bleeding/discharge, any urinary symptoms, or any other new/concerning symptoms

## 2020-05-15 ENCOUNTER — Telehealth (HOSPITAL_COMMUNITY): Payer: Self-pay

## 2020-05-15 NOTE — Telephone Encounter (Signed)
Called to Discuss with patient about Covid symptoms and the use of the monoclonal antibody infusion for those with mild to moderate Covid symptoms and at a high risk of hospitalization.     Pt is qualified for this infusion due to co-morbid conditions and/or a member of an at-risk group.     Patient Active Problem List   Diagnosis Date Noted  . Post-operative state 02/23/2017    Patient declines infusion at this time. Symptoms tier reviewed as well as criteria for ending isolation. Preventative practices reviewed. Patient verbalized understanding.    Patient advised to call back if he/she decides that he/she does want to get infusion. Callback number to the infusion center given. Patient advised to go to Urgent care or ED with severe symptoms.   Patient mentioned she rather research about the medication and talk to her husband about it. Patient is also pregnant with sx onset 11/27. I informed patient that she has a day 10 window to call the [facility]AB hotline to schedule appointment.   Patient is spanish speaking and conversation was in spanish with patient. All questions answered.

## 2020-06-04 LAB — OB RESULTS CONSOLE ABO/RH: RH Type: POSITIVE

## 2020-06-04 LAB — OB RESULTS CONSOLE ANTIBODY SCREEN: Antibody Screen: NEGATIVE

## 2020-06-04 LAB — OB RESULTS CONSOLE GC/CHLAMYDIA
Chlamydia: NEGATIVE
Gonorrhea: POSITIVE

## 2020-06-04 LAB — GLUCOSE, 1 HOUR GESTATIONAL: Glucose, 1 Hour GTT: 150

## 2020-06-04 LAB — OB RESULTS CONSOLE RPR: RPR: NONREACTIVE

## 2020-06-04 LAB — HEPATITIS C ANTIBODY: HCV Ab: NEGATIVE

## 2020-06-04 LAB — OB RESULTS CONSOLE HGB/HCT, BLOOD
HCT: 33 (ref 29–41)
Hemoglobin: 10.9

## 2020-06-04 LAB — OB RESULTS CONSOLE RUBELLA ANTIBODY, IGM: Rubella: IMMUNE

## 2020-06-04 LAB — OB RESULTS CONSOLE HEPATITIS B SURFACE ANTIGEN: Hepatitis B Surface Ag: NEGATIVE

## 2020-06-04 LAB — OB RESULTS CONSOLE HIV ANTIBODY (ROUTINE TESTING): HIV: NONREACTIVE

## 2020-06-04 LAB — CYTOLOGY - PAP: Pap: NEGATIVE

## 2020-06-04 LAB — OB RESULTS CONSOLE PLATELET COUNT: Platelets: 448

## 2020-06-12 LAB — GLUCOSE TOLERANCE, 3 HOURS
Glucose 1 Hour: 164
Glucose 2 Hour: 154
Glucose 3 Hour: 83
Glucose Fasting: 97

## 2020-07-03 LAB — OB RESULTS CONSOLE GC/CHLAMYDIA
Chlamydia: NEGATIVE
Gonorrhea: NEGATIVE

## 2020-07-10 LAB — OB RESULTS CONSOLE HGB/HCT, BLOOD
HCT: 35 (ref 29–41)
Hemoglobin: 11.8

## 2020-07-10 LAB — GLUCOSE TOLERANCE, 3 HOURS
Glucose 1 Hour: 216
Glucose 2 Hour: 181
Glucose 3 Hour: 142
Glucose Fasting: 94

## 2020-07-10 LAB — OB RESULTS CONSOLE RPR: RPR: NONREACTIVE

## 2020-07-12 ENCOUNTER — Ambulatory Visit: Payer: Self-pay | Admitting: Registered"

## 2020-07-12 ENCOUNTER — Other Ambulatory Visit: Payer: Self-pay | Admitting: Family

## 2020-07-12 ENCOUNTER — Encounter: Payer: Self-pay | Admitting: Registered"

## 2020-07-12 ENCOUNTER — Encounter: Payer: Self-pay | Attending: Obstetrics & Gynecology | Admitting: Registered"

## 2020-07-12 ENCOUNTER — Other Ambulatory Visit: Payer: Self-pay

## 2020-07-12 DIAGNOSIS — Z8632 Personal history of gestational diabetes: Secondary | ICD-10-CM | POA: Insufficient documentation

## 2020-07-12 DIAGNOSIS — O24414 Gestational diabetes mellitus in pregnancy, insulin controlled: Secondary | ICD-10-CM | POA: Insufficient documentation

## 2020-07-12 DIAGNOSIS — Z3A00 Weeks of gestation of pregnancy not specified: Secondary | ICD-10-CM | POA: Insufficient documentation

## 2020-07-12 DIAGNOSIS — O24419 Gestational diabetes mellitus in pregnancy, unspecified control: Secondary | ICD-10-CM | POA: Insufficient documentation

## 2020-07-12 DIAGNOSIS — Z363 Encounter for antenatal screening for malformations: Secondary | ICD-10-CM

## 2020-07-12 NOTE — Progress Notes (Signed)
Interpreter services provided by Audelia Hives [phone] (first half, switched due to audio difficulty) Jacqulyn Bath [phone]  from [place]  Patient was seen on 07/12/20 for Gestational Diabetes self-management. EDD 10/09/20; [redacted]w[redacted]d Patient states no history of GDM. Diet history obtained. Patient has restricted her diet to mostly vegetables due to fear of blood sugar getting too high. Beverages include water, 8 oz juice not at one time, but throughout the day.   The following learning objectives were met by the patient :   States the definition of Gestational Diabetes  States why dietary management is important in controlling blood glucose  Describes the effects of carbohydrates on blood glucose levels  Demonstrates ability to create a balanced meal plan  Demonstrates carbohydrate counting   States when to check blood glucose levels  Demonstrates proper blood glucose monitoring techniques  States the effect of stress and exercise on blood glucose levels  States the importance of limiting caffeine and abstaining from alcohol and smoking  Plan:  Aim for 3 Carbohydrate Choices per meal (45 grams) +/- 1 either way  Aim for 1-2 Carbohydrate Choices per snack Begin reading food labels for Total Carbohydrate of foods If OK with your MD, consider  increasing your activity level by walking, Arm Chair Exercises or other activity daily as tolerated Begin checking Blood Glucose before breakfast and 2 hours after first bite of breakfast, lunch and dinner as directed by MD  Bring Log Book/Sheet and meter to every medical appointment  Baby Scripts: (BS 2.0 not capable of glucose management at this time.) Patient to record blood sugar on glucose log sheet  Take medication if directed by MD  Blood glucose monitor given: Prodigy Lot # [id number]: 95 mg/dL  Patient instructed to monitor glucose levels: FBS: 60 - 95 mg/dl 2 hour: <120 mg/dl  Patient received the following handouts:  Nutrition  Diabetes and Pregnancy  Carbohydrate Counting List  Blood glucose Log Sheet  Patient will be seen for follow-up in 1 weeks or as needed.

## 2020-07-16 ENCOUNTER — Other Ambulatory Visit: Payer: Self-pay | Admitting: *Deleted

## 2020-07-16 ENCOUNTER — Other Ambulatory Visit: Payer: Self-pay

## 2020-07-16 DIAGNOSIS — O24419 Gestational diabetes mellitus in pregnancy, unspecified control: Secondary | ICD-10-CM

## 2020-07-17 ENCOUNTER — Encounter: Payer: Self-pay | Admitting: *Deleted

## 2020-07-19 ENCOUNTER — Ambulatory Visit: Payer: Self-pay | Attending: Family

## 2020-07-19 ENCOUNTER — Ambulatory Visit: Payer: Self-pay | Admitting: *Deleted

## 2020-07-19 ENCOUNTER — Encounter: Payer: Self-pay | Admitting: *Deleted

## 2020-07-19 ENCOUNTER — Other Ambulatory Visit: Payer: Self-pay

## 2020-07-19 VITALS — BP 128/59 | HR 89

## 2020-07-19 DIAGNOSIS — O09523 Supervision of elderly multigravida, third trimester: Secondary | ICD-10-CM

## 2020-07-19 DIAGNOSIS — Z363 Encounter for antenatal screening for malformations: Secondary | ICD-10-CM | POA: Insufficient documentation

## 2020-07-20 ENCOUNTER — Other Ambulatory Visit: Payer: Self-pay | Admitting: *Deleted

## 2020-07-20 DIAGNOSIS — O24419 Gestational diabetes mellitus in pregnancy, unspecified control: Secondary | ICD-10-CM

## 2020-07-26 ENCOUNTER — Encounter: Payer: Self-pay | Admitting: Obstetrics and Gynecology

## 2020-07-31 ENCOUNTER — Encounter: Payer: Self-pay | Attending: Obstetrics & Gynecology | Admitting: Registered"

## 2020-07-31 ENCOUNTER — Ambulatory Visit (INDEPENDENT_AMBULATORY_CARE_PROVIDER_SITE_OTHER): Payer: Self-pay | Admitting: Family Medicine

## 2020-07-31 ENCOUNTER — Ambulatory Visit: Payer: Self-pay | Admitting: Registered"

## 2020-07-31 ENCOUNTER — Encounter: Payer: Self-pay | Admitting: Family Medicine

## 2020-07-31 ENCOUNTER — Other Ambulatory Visit: Payer: Self-pay

## 2020-07-31 VITALS — BP 120/75 | HR 96 | Wt 220.0 lb

## 2020-07-31 DIAGNOSIS — O24419 Gestational diabetes mellitus in pregnancy, unspecified control: Secondary | ICD-10-CM

## 2020-07-31 DIAGNOSIS — Z3A00 Weeks of gestation of pregnancy not specified: Secondary | ICD-10-CM | POA: Insufficient documentation

## 2020-07-31 DIAGNOSIS — O0990 Supervision of high risk pregnancy, unspecified, unspecified trimester: Secondary | ICD-10-CM

## 2020-07-31 DIAGNOSIS — O24410 Gestational diabetes mellitus in pregnancy, diet controlled: Secondary | ICD-10-CM

## 2020-07-31 NOTE — Patient Instructions (Signed)
 Eleccin del mtodo anticonceptivo Contraception Choices La anticoncepcin, o los mtodos anticonceptivos, hace referencia a los mtodos o dispositivos que evitan el embarazo. Mtodos hormonales Implante anticonceptivo Un implante anticonceptivo consiste en un tubo delgado de plstico que contiene una hormona que evita el embarazo. Es diferente de un dispositivo intrauterino (DIU). Un mdico lo inserta en la parte superior del brazo. Los implantes pueden ser eficaces durante un mximo de 3 aos. Inyecciones de progestina sola Las inyecciones de progestina sola contienen progestina, una forma sinttica de la hormona progesterona. Un mdico las administra cada 3 meses. Pldoras anticonceptivas Las pldoras anticonceptivas son pastillas que contienen hormonas que evitan el embarazo. Deben tomarse una vez al da, preferentemente a la misma hora cada da. Se necesita una receta para utilizar este mtodo anticonceptivo. Parche anticonceptivo El parche anticonceptivo contiene hormonas que evitan el embarazo. Se coloca en la piel, debe cambiarse una vez a la semana durante tres semanas y debe retirarse en la cuarta semana. Se necesita una receta para utilizar este mtodo anticonceptivo. Anillo vaginal Un anillo vaginal contiene hormonas que evitan el embarazo. Se coloca en la vagina durante tres semanas y se retira en la cuarta semana. Luego se repite el proceso con un anillo nuevo. Se necesita una receta para utilizar este mtodo anticonceptivo. Anticonceptivo de emergencia Los anticonceptivos de emergencia son mtodos para evitar un embarazo despus de tener sexo sin proteccin. Vienen en forma de pldora y pueden tomarse hasta 5 das despus de tener sexo. Funcionan mejor cuando se toman lo ms pronto posible luego de tener sexo. La mayora de los anticonceptivos de emergencia estn disponibles sin receta mdica. Este mtodo no debe utilizarse como el nico mtodo anticonceptivo.   Mtodos de  barrera Condn masculino Un condn masculino es una vaina delgada que se coloca sobre el pene durante el sexo. Los condones evitan que el esperma ingrese en el cuerpo de la mujer. Pueden utilizarse con un una sustancia que mata a los espermatozoides (espermicida) para aumentar la efectividad. Deben desecharse despus de un uso. Condn femenino Un condn femenino es una vaina blanda y holgada que se coloca en la vagina antes de tener sexo. El condn evita que el esperma ingrese en el cuerpo de la mujer. Deben desecharse despus de un uso. Diafragma Un diafragma es una barrera blanda con forma de cpula. Se inserta en la vagina antes del sexo, junto con un espermicida. El diafragma bloquea el ingreso de esperma en el tero, y el espermicida mata a los espermatozoides. El diafragma debe permanecer en la vagina durante 6 a 8 horas despus de tener sexo y debe retirarse en el plazo de las 24 horas. Un diafragma es recetado y colocado por un mdico. Debe reemplazarse cada 1 a 2 aos, despus de dar a luz, de aumentar ms de 15lb (6.8kg) y de una ciruga plvica. Capuchn cervical Un capuchn cervical es una copa redonda y blanda de ltex o plstico que se coloca en el cuello uterino. Se inserta en la vagina antes del sexo, junto con un espermicida. Bloquea el ingreso del esperma en el tero. El capuchn debe permanecer en el lugar durante 6 a 8 horas despus de tener sexo y debe retirarse en el plazo de las 48 horas. Un capuchn cervical debe ser recetado y colocado por un mdico. Debe reemplazarse cada 2aos. Esponja Una esponja es una pieza blanda y circular de espuma de poliuretano que contiene espermicida. La esponja ayuda a bloquear el ingreso de esperma en el tero, y el   espermicida mata a los espermatozoides. Para utilizarla, debe humedecerla e insertarla en la vagina. Debe insertarse antes de tener sexo, debe permanecer dentro al menos durante 6 horas despus de tener sexo y debe retirarse y  desecharse en el plazo de las 30 horas. Espermicidas Los espermicidas son sustancias qumicas que matan o bloquean al esperma y no lo dejan ingresar al cuello uterino y al tero. Vienen en forma de crema, gel, supositorio, espuma o comprimido. Un espermicida debe insertarse en la vagina con un aplicador al menos 10 o 15 minutos antes de tener sexo para dar tiempo a que surta efecto. El proceso debe repetirse cada vez que tenga sexo. Los espermicidas no requieren receta mdica.   Anticonceptivos intrauterinos Dispositivo intrauterino (DIU) Un DIU es un dispositivo en forma de T que se coloca en el tero. Existen dos tipos:  DIU hormonal.Este tipo contiene progestina, una forma sinttica de la hormona progesterona. Este tipo puede permanecer colocado durante 3 a 5 aos.  DIU de cobre.Este tipo est recubierto con un alambre de cobre. Puede permanecer colocado durante 10 aos. Mtodos anticonceptivos permanentes Ligadura de trompas en la mujer En este mtodo, se sellan, atan u obstruyen las trompas de Falopio durante una ciruga para evitar que el vulo descienda hacia el tero. Esterilizacin histeroscpica En este mtodo, se coloca un implante pequeo y flexible dentro de cada trompa de Falopio. Los implantes hacen que se forme un tejido cicatricial en las trompas de Falopio y que las obstruya para que el espermatozoide no pueda llegar al vulo. El procedimiento demora alrededor de 3 meses para que sea efectivo. Debe utilizarse otro mtodo anticonceptivo durante esos 3 meses. Esterilizacin masculina Este es un procedimiento que consiste en atar los conductos que transportan el esperma (vasectoma). Luego del procedimiento, el hombre puede eyacular lquido (semen). Debe utilizarse otro mtodo anticonceptivo durante 3 meses despus del procedimiento. Mtodos de planificacin natural Planificacin familiar natural En este mtodo, la pareja no tiene sexo durante los das en que la mujer podra quedar  embarazada. Mtodo calendario En este mtodo, la mujer realiza un seguimiento de la duracin de cada ciclo menstrual, identifica los das en los que se puede producir un embarazo y no tiene sexo durante esos das. Mtodo de la ovulacin En este mtodo, la pareja evita tener sexo durante la ovulacin. Mtodo sintotrmico Este mtodo implica no tener sexo durante la ovulacin. Normalmente, la mujer comprueba la ovulacin al observar cambios en su temperatura y en la consistencia del moco cervical. Mtodo posovulacin En este mtodo, la pareja espera a que finalice la ovulacin para tener sexo. Dnde buscar ms informacin  Centers for Disease Control and Prevention (Centros para el Control y la Prevencin de Enfermedades): www.cdc.gov Resumen  La anticoncepcin, o los mtodos anticonceptivos, hace referencia a los mtodos o dispositivos que evitan el embarazo.  Los mtodos anticonceptivos hormonales incluyen implantes, inyecciones, pastillas, parches, anillos vaginales y anticonceptivos de emergencia.  Los mtodos anticonceptivos de barrera pueden incluir condones masculinos, condones femeninos, diafragmas, capuchones cervicales, esponjas y espermicidas.  Existen dos tipos de DIU (dispositivo intrauterino). Un DIU puede colocarse en el tero de una mujer para evitar el embarazo durante 3 a 5 aos.  La esterilizacin permanente puede realizarse mediante un procedimiento tanto en los hombres como en las mujeres. Los mtodos de planificacin familiar natural implican no tener sexo durante los das en que la mujer podra quedar embarazada. Esta informacin no tiene como fin reemplazar el consejo del mdico. Asegrese de hacerle al mdico cualquier pregunta que   tenga. Document Revised: 01/03/2020 Document Reviewed: 01/03/2020 Elsevier Patient Education  2021 [employer].   Lactancia materna Breastfeeding  Decidir amamantar es una de las mejores elecciones que puede hacer por usted y su  beb. Un cambio en las hormonas durante el embarazo hace que las mamas produzcan leche materna en las glndulas productoras de leche. Las hormonas impiden que la leche materna sea liberada antes del nacimiento del beb. Adems, impulsan el flujo de leche luego del nacimiento. Una vez que ha comenzado a amamantar, pensar en el beb, as como la succin o el llanto, pueden estimular la liberacin de leche de las glndulas productoras de leche. Los beneficios de amamantar Las investigaciones demuestran que la lactancia materna ofrece muchos beneficios de salud para bebs y madres. Adems, ofrece una forma gratuita y conveniente de alimentar al beb. Para el beb  La primera leche (calostro) ayuda a mejorar el funcionamiento del aparato digestivo del beb.  Las clulas especiales de la leche (anticuerpos) ayudan a combatir las infecciones en el beb.  Los bebs que se alimentan con leche materna tambin tienen menos probabilidades de tener asma, alergias, obesidad o diabetes de tipo 2. Adems, tienen menor riesgo de sufrir el sndrome de muerte sbita del lactante (SMSL).  Los nutrientes de la leche materna son mejores para satisfacer las necesidades del beb en comparacin con la leche maternizada.  La leche materna mejora el desarrollo cerebral del beb. Para usted  La lactancia materna favorece el desarrollo de un vnculo muy especial entre la madre y el beb.  Es conveniente. La leche materna es econmica y siempre est disponible a la temperatura correcta.  La lactancia materna ayuda a quemar caloras. Le ayuda a perder el peso ganado durante el embarazo.  Hace que el tero vuelva al tamao que tena antes del embarazo ms rpido. Adems, disminuye el sangrado (loquios) despus del parto.  La lactancia materna contribuye a reducir el riesgo de tener diabetes de tipo 2, osteoporosis, artritis reumatoide, enfermedades cardiovasculares y cncer de mama, ovario, tero y endometrio en el  futuro. Informacin bsica sobre la lactancia Comienzo de la lactancia  Encuentre un lugar cmodo para sentarse o acostarse, con un buen respaldo para el cuello y la espalda.  Coloque una almohada o una manta enrollada debajo del beb para acomodarlo a la altura de la mama (si est sentada). Las almohadas para amamantar se han diseado especialmente a fin de servir de apoyo para los brazos y el beb mientras amamanta.  Asegrese de que la barriga del beb (abdomen) est frente a la suya.  Masajee suavemente la mama. Con las yemas de los dedos, masajee los bordes exteriores de la mama hacia adentro, en direccin al pezn. Esto estimula el flujo de leche. Si la leche fluye lentamente, es posible que deba continuar con este movimiento durante la lactancia.  Sostenga la mama con 4 dedos por debajo y el pulgar por arriba del pezn (forme la letra "C" con la mano). Asegrese de que los dedos se encuentren lejos del pezn y de la boca del beb.  Empuje suavemente los labios del beb con el pezn o con el dedo.  Cuando la boca del beb se abra lo suficiente, acrquelo rpidamente a la mama e introduzca todo el pezn y la arola, tanto como sea posible, dentro de la boca del beb. La arola es la zona de color que rodea al pezn. ? Debe haber ms arola visible por arriba del labio superior del beb que por debajo del labio   inferior. ? Los labios del beb deben estar abiertos y extendidos hacia afuera (evertidos) para asegurar que el beb se prenda de forma adecuada y cmoda. ? La lengua del beb debe estar entre la enca inferior y la mama.  Asegrese de que la boca del beb est en la posicin correcta alrededor del pezn (prendido). Los labios del beb deben crear un sello sobre la mama y estar doblados hacia afuera (invertidos).  Es comn que el beb succione durante 2 a 3 minutos para que comience el flujo de leche materna. Cmo debe prenderse Es muy importante que le ensee al beb cmo  prenderse adecuadamente a la mama. Si el beb no se prende adecuadamente, puede causar dolor en los pezones, reducir la produccin de leche materna y hacer que el beb tenga un escaso aumento de peso. Adems, si el beb no se prende adecuadamente al pezn, puede tragar aire durante la alimentacin. Esto puede causarle molestias al beb. Hacer eructar al beb al cambiar de mama puede ayudarlo a liberar el aire. Sin embargo, ensearle al beb cmo prenderse a la mama adecuadamente es la mejor manera de evitar que se sienta molesto por tragar aire mientras se alimenta. Signos de que el beb se ha prendido adecuadamente al pezn  Tironea o succiona de modo silencioso, sin causarle dolor. Los labios del beb deben estar extendidos hacia afuera (evertidos).  Se escucha que traga cada 3 o 4 succiones una vez que la leche ha comenzado a fluir (despus de que se produzca el reflejo de eyeccin de la leche).  Hay movimientos musculares por arriba y por delante de sus odos al succionar. Signos de que el beb no se ha prendido adecuadamente al pezn  Hace ruidos de succin o de chasquido mientras se alimenta.  Siente dolor en los pezones. Si cree que el beb no se prendi correctamente, deslice el dedo en la comisura de la boca y colquelo entre las encas del beb para interrumpir la succin. Intente volver a comenzar a amamantar. Signos de lactancia materna exitosa Signos del beb  El beb disminuir gradualmente el nmero de succiones o dejar de succionar por completo.  El beb se quedar dormido.  El cuerpo del beb se relajar.  El beb retendr una pequea cantidad de leche en la boca.  El beb se desprender solo del pecho. Signos que presenta usted  Las mamas han aumentado la firmeza, el peso y el tamao 1 a 3 horas despus de amamantar.  Estn ms blandas inmediatamente despus de amamantar.  Se producen un aumento del volumen de leche y un cambio en su consistencia y color hacia el  quinto da de lactancia.  Los pezones no duelen, no estn agrietados ni sangran. Signos de que su beb recibe la cantidad de leche suficiente  Mojar por lo menos 1 o 2paales durante las primeras 24horas despus del nacimiento.  Mojar por lo menos 5 o 6paales cada 24horas durante la primera semana despus del nacimiento. La orina debe ser clara o de color amarillo plido a los 5das de vida.  Mojar entre 6 y 8paales cada 24horas a medida que el beb sigue creciendo y desarrollndose.  Defeca por lo menos 3 veces en 24 horas a los 5 das de vida. Las heces deben ser blandas y amarillentas.  Defeca por lo menos 3 veces en 24 horas a los 7 das de vida. Las heces deben ser grumosas y amarillentas.  No registra una prdida de peso mayor al 10% del peso al   nacer durante los primeros 3 das de vida.  Aumenta de peso un promedio de 4 a 7onzas (113 a 198g) por semana despus de los 4 das de vida.  Aumenta de peso, diariamente, de manera uniforme a partir de los 5 das de vida, sin registrar prdida de peso despus de las 2semanas de vida. Despus de alimentarse, es posible que el beb regurgite una pequea cantidad de leche. Esto es normal. Frecuencia y duracin de la lactancia El amamantamiento frecuente la ayudar a producir ms leche y puede prevenir dolores en los pezones y las mamas extremadamente llenas (congestin mamaria). Alimente al beb cuando muestre signos de hambre o si siente la necesidad de reducir la congestin de las mamas. Esto se denomina "lactancia a demanda". Las seales de que el beb tiene hambre incluyen las siguientes:  Aumento del estado de alerta, actividad o inquietud.  Mueve la cabeza de un lado a otro.  Abre la boca cuando se le toca la mejilla o la comisura de la boca (reflejo de bsqueda).  Aumenta las vocalizaciones, tales como sonidos de succin, se relame los labios, emite arrullos, suspiros o chirridos.  Mueve la mano hacia la boca y se chupa  los dedos o las manos.  Est molesto o llora. Evite el uso del chupete en las primeras 4 a 6 semanas despus del nacimiento del beb. Despus de este perodo, podr usar un chupete. Las investigaciones demostraron que el uso del chupete durante el primer ao de vida del beb disminuye el riesgo de tener el sndrome de muerte sbita del lactante (SMSL). Permita que el nio se alimente en cada mama todo lo que desee. Cuando el beb se desprende o se queda dormido mientras se est alimentando de la primera mama, ofrzcale la segunda. Debido a que, con frecuencia, los recin nacidos estn somnolientos las primeras semanas de vida, es posible que deba despertar al beb para alimentarlo. Los horarios de lactancia varan de un beb a otro. Sin embargo, las siguientes reglas pueden servir como gua para ayudarla a garantizar que el beb se alimenta adecuadamente:  Se puede amamantar a los recin nacidos (bebs de 4 semanas o menos de vida) cada 1 a 3 horas.  No deben transcurrir ms de 3 horas durante el da o 5 horas durante la noche sin que se amamante a los recin nacidos.  Debe amamantar al beb un mnimo de 8 veces en un perodo de 24 horas. Extraccin de leche materna La extraccin y el almacenamiento de la leche materna le permiten asegurarse de que el beb se alimente exclusivamente de su leche materna, aun en momentos en los que no puede amamantar. Esto tiene especial importancia si debe regresar al trabajo en el perodo en que an est amamantando o si no puede estar presente en los momentos en que el beb debe alimentarse. Su asesor en lactancia puede ayudarla a encontrar un mtodo de extraccin que funcione mejor para usted y orientarla sobre cunto tiempo es seguro almacenar leche materna.      Cmo cuidar las mamas durante la lactancia Los pezones pueden secarse, agrietarse y doler durante la lactancia. Las siguientes recomendaciones pueden ayudarla a mantener las mamas humectadas y  sanas:  Evite usar jabn en los pezones.  Use un sostn de soporte diseado especialmente para la lactancia materna. Evite usar sostenes con aro o sostenes muy ajustados (sostenes deportivos).  Seque al aire sus pezones durante 3 a 4minutos despus de amamantar al beb.  Utilice solo apsitos de algodn en   el sostn para absorber las prdidas de leche. La prdida de un poco de leche materna entre las tomas es normal.  Utilice lanolina sobre los pezones luego de amamantar. La lanolina ayuda a mantener la humedad normal de la piel. La lanolina pura no es perjudicial (no es txica) para el beb. Adems, puede extraer manualmente algunas gotas de leche materna y masajear suavemente esa leche sobre los pezones para que la leche se seque al aire. Durante las primeras semanas despus del nacimiento, algunas mujeres experimentan congestin mamaria. La congestin mamaria puede hacer que sienta las mamas pesadas, calientes y sensibles al tacto. El pico de la congestin mamaria ocurre en el plazo de los 3 a 5 das despus del parto. Las siguientes recomendaciones pueden ayudarla a aliviar la congestin mamaria:  Vace por completo las mamas al amamantar o extraer leche. Puede aplicar calor hmedo en las mamas (en la ducha o con toallas hmedas para manos) antes de amamantar o extraer leche. Esto aumenta la circulacin y ayuda a que la leche fluya. Si el beb no vaca por completo las mamas cuando lo amamanta, extraiga la leche restante despus de que haya finalizado.  Aplique compresas de hielo sobre las mamas inmediatamente despus de amamantar o extraer leche, a menos que le resulte demasiado incmodo. Haga lo siguiente: ? Ponga el hielo en una bolsa plstica. ? Coloque una toalla entre la piel y la bolsa de hielo. ? Coloque el hielo durante 20minutos, 2 o 3veces por da.  Asegrese de que el beb est prendido y se encuentre en la posicin correcta mientras lo alimenta. Si la congestin mamaria  persiste luego de 48 horas o despus de seguir estas recomendaciones, comunquese con su mdico o un asesor en lactancia. Recomendaciones de salud general durante la lactancia  Consuma 3 comidas y 3 colaciones saludables todos los das. Las madres bien alimentadas que amamantan necesitan entre 450 y 500 caloras adicionales por da. Puede cumplir con este requisito al aumentar la cantidad de una dieta equilibrada que realice.  Beba suficiente agua para mantener la orina clara o de color amarillo plido.  Descanse con frecuencia, reljese y siga tomando sus vitaminas prenatales para prevenir la fatiga, el estrs y los niveles bajos de vitaminas y minerales en el cuerpo (deficiencias de nutrientes).  No consuma ningn producto que contenga nicotina o tabaco, como cigarrillos y cigarrillos electrnicos. El beb puede verse afectado por las sustancias qumicas de los cigarrillos que pasan a la leche materna y por la exposicin al humo ambiental del tabaco. Si necesita ayuda para dejar de fumar, consulte al mdico.  Evite el consumo de alcohol.  No consuma drogas ilegales o marihuana.  Antes de usar cualquier medicamento, hable con el mdico. Estos incluyen medicamentos recetados y de venta libre, como tambin vitaminas y suplementos a base de hierbas. Algunos medicamentos, que pueden ser perjudiciales para el beb, pueden pasar a travs de la leche materna.  Puede quedar embarazada durante la lactancia. Si se desea un mtodo anticonceptivo, consulte al mdico sobre cules son las opciones seguras durante la lactancia. Dnde encontrar ms informacin: Liga internacional La Leche: www.llli.org. Comunquese con un mdico si:  Siente que quiere dejar de amamantar o se siente frustrada con la lactancia.  Sus pezones estn agrietados o sangran.  Sus mamas estn irritadas, sensibles o calientes.  Tiene los siguientes sntomas: ? Dolor en las mamas o en los pezones. ? Un rea hinchada en cualquiera  de las mamas. ? Fiebre o escalofros. ? Nuseas o vmitos. ?   Drenaje de otro lquido distinto de la leche materna desde los pezones.  Sus mamas no se llenan antes de amamantar al beb para el quinto da despus del parto.  Se siente triste y deprimida.  El beb: ? Est demasiado somnoliento como para comer bien. ? Tiene problemas para dormir. ? Tiene ms de 1 semana de vida y moja menos de 6 paales en un periodo de 24 horas. ? No ha aumentado de peso a los 5 das de vida.  El beb defeca menos de 3 veces en 24 horas.  La piel del beb o las partes blancas de los ojos se vuelven amarillentas. Solicite ayuda de inmediato si:  El beb est muy cansado (letargo) y no se quiere despertar para comer.  Le sube la fiebre sin causa. Resumen  La lactancia materna ofrece muchos beneficios de salud para bebs y madres.  Intente amamantar a su beb cuando muestre signos tempranos de hambre.  Haga cosquillas o empuje suavemente los labios del beb con el dedo o el pezn para lograr que el beb abra la boca. Acerque el beb a la mama. Asegrese de que la mayor parte de la arola se encuentre dentro de la boca del beb. Ofrzcale una mama y haga eructar al beb antes de pasar a la otra.  Hable con su mdico o asesor en lactancia si tiene dudas o problemas con la lactancia. Esta informacin no tiene como fin reemplazar el consejo del mdico. Asegrese de hacerle al mdico cualquier pregunta que tenga. Document Revised: 08/27/2017 Document Reviewed: 09/22/2016 Elsevier Patient Education  2021 [employer] Inc.  

## 2020-07-31 NOTE — Progress Notes (Signed)
Subjective:   Sara Fowler is a 35 y.o. [id number] at [redacted]w[redacted]d by midtrimester ultrasound, unsure LMP, being seen today for her first obstetrical visit, she is transferring from [facility] due to GDM. Her obstetrical history is significant for advanced maternal age, large for gestational age and GDM. Patient does intend to breast feed. Pregnancy history fully reviewed.  Patient reports no complaints.  HISTORY: OB History  Gravida Para Term Preterm AB Living  4 3 3  0 0 3  SAB IAB Ectopic Multiple Live Births  0 0 0 0 3    # Outcome Date GA Lbr Len/2nd Weight Sex Delivery Anes PTL Lv  4 Current           3 Term 07/25/09 [redacted]w[redacted]d   M Vag-Spont   LIV  2 Term 01/03/04 [redacted]w[redacted]d   F Vag-Spont   LIV  1 Term 07/13/02 [redacted]w[redacted]d   F Vag-Spont   LIV   Last pap smear was  06/04/2020 and was normal Past Medical History:  Diagnosis Date  . Abscess of breast    Right  . COVID-19   . Malpositioned IUD 12/2016  . Stress headaches    Past Surgical History:  Procedure Laterality Date  . HYSTEROSCOPY WITH D & C N/A 01/14/2017   Procedure: DILATATION AND CURETTAGE /HYSTEROSCOPY W/ IUD REMOVAL;  Surgeon: 03/16/2017, MD;  Location: [facility];  Service: Gynecology;  Laterality: N/A;  . NO PAST SURGERIES     Family History  Problem Relation Age of Onset  . Asthma Daughter    Social History   Tobacco Use  . Smoking status: Never Smoker  . Smokeless tobacco: Never Used  Vaping Use  . Vaping Use: Never used  Substance Use Topics  . Alcohol use: Not Currently    Comment: occasionally  . Drug use: Never   No Known Allergies Current Outpatient Medications on File Prior to Visit  Medication Sig Dispense Refill  . acetaminophen (TYLENOL) 500 MG tablet Take 500 mg by mouth every 6 (six) hours as needed.    . Prenatal Vit-Fe Fumarate-FA (PRENATAL MULTIVITAMIN) TABS tablet Take 1 tablet by mouth daily at 12 noon.     No current facility-administered medications on file prior to visit.      Exam   Vitals:   07/31/20 1022  BP: 120/75  Pulse: 96  Weight: 220 lb (99.8 kg)   Fetal Heart Rate (bpm): 135  System: General: well-developed, well-nourished female in no acute distress   Skin: normal coloration and turgor, no rashes   Neurologic: oriented, normal, negative, normal mood   Extremities: normal strength, tone, and muscle mass, ROM of all joints is normal   HEENT PERRLA, extraocular movement intact and sclera clear, anicteric   Neck supple and no masses   Respiratory:  no respiratory distress     Assessment:   Pregnancy: 08/02/20 Patient Active Problem List   Diagnosis Date Noted  . Supervision of high risk pregnancy, antepartum 07/31/2020  . Gestational diabetes mellitus (GDM), antepartum 07/12/2020  . Post-operative state 02/23/2017     Plan:  1. Supervision of high risk pregnancy, antepartum Records reviewed from [facility] Asymptomatic bacteriuria noted, TOC collected Discussed contraception, does not want more children, discussed LARC options, she will consider - Culture, OB Urine  2. Diet controlled gestational diabetes mellitus (GDM), antepartum Not checking sugars yet Reviewed risks of uncontrolled GDM including stillbirth, macrosomia, complications at delivery Reviewed QID checks, will review sugars at next visit and determine if  more tx is needed Following w MFM for growth scans, most recent was normal  3. Asymptomatic bacteriuria Noted on labs from [facility], TOC collected   Routine obstetric precautions reviewed. Return in 2 weeks (on 08/14/2020) for [facility], ob visit, in person.

## 2020-08-02 LAB — CULTURE, OB URINE

## 2020-08-02 LAB — URINE CULTURE, OB REFLEX

## 2020-08-02 NOTE — Progress Notes (Signed)
In-person Interpreter services provided by Raquel from [employer])  Patient was seen on 08/02/20 for follow-up assessment and education for Gestational Diabetes. EDD 10/09/20; [redacted]w[redacted]d. Patient states changes to diet/lifestyle including cutting down on number of times that she is eating.   Pt reports she eats lunch at work. Meals described were mostly carbohydrates.  Patient is testing blood glucose as directed pre breakfast and 2 hours after each meal.  Pt reports FBS was 118 mg/dL today; 354 mg/dL after breakfast. Highest she has seen 160 mg/dL  The following learning objectives reviewed during follow-up visit:   Practiced creating balanced meals with food models  Plan:  . Work on creating more balanced meals . Bring log sheet to all appointments   Patient instructed to monitor glucose levels: FBS: 60 - 95 mg/dl 2 hour: <656 mg/dl  Patient received the following handouts:  none  Patient will be seen for follow-up as needed.

## 2020-08-13 ENCOUNTER — Telehealth: Payer: Self-pay

## 2020-08-13 NOTE — Telephone Encounter (Signed)
Created GFE and sent to patient via MyChart.

## 2020-08-14 ENCOUNTER — Encounter: Payer: Self-pay | Attending: Obstetrics & Gynecology | Admitting: Registered"

## 2020-08-14 ENCOUNTER — Encounter: Payer: Self-pay | Admitting: Obstetrics and Gynecology

## 2020-08-14 ENCOUNTER — Other Ambulatory Visit: Payer: Self-pay

## 2020-08-14 ENCOUNTER — Ambulatory Visit: Payer: Self-pay | Admitting: Registered"

## 2020-08-14 ENCOUNTER — Ambulatory Visit (INDEPENDENT_AMBULATORY_CARE_PROVIDER_SITE_OTHER): Payer: Self-pay | Admitting: Obstetrics and Gynecology

## 2020-08-14 VITALS — BP 124/85 | HR 86 | Wt 227.7 lb

## 2020-08-14 DIAGNOSIS — O09529 Supervision of elderly multigravida, unspecified trimester: Secondary | ICD-10-CM | POA: Insufficient documentation

## 2020-08-14 DIAGNOSIS — R8271 Bacteriuria: Secondary | ICD-10-CM | POA: Insufficient documentation

## 2020-08-14 DIAGNOSIS — Z3A32 32 weeks gestation of pregnancy: Secondary | ICD-10-CM

## 2020-08-14 DIAGNOSIS — O0990 Supervision of high risk pregnancy, unspecified, unspecified trimester: Secondary | ICD-10-CM

## 2020-08-14 DIAGNOSIS — Z758 Other problems related to medical facilities and other health care: Secondary | ICD-10-CM

## 2020-08-14 DIAGNOSIS — Z789 Other specified health status: Secondary | ICD-10-CM

## 2020-08-14 DIAGNOSIS — O99891 Other specified diseases and conditions complicating pregnancy: Secondary | ICD-10-CM | POA: Insufficient documentation

## 2020-08-14 DIAGNOSIS — O09523 Supervision of elderly multigravida, third trimester: Secondary | ICD-10-CM

## 2020-08-14 DIAGNOSIS — Z3A00 Weeks of gestation of pregnancy not specified: Secondary | ICD-10-CM | POA: Insufficient documentation

## 2020-08-14 DIAGNOSIS — O24414 Gestational diabetes mellitus in pregnancy, insulin controlled: Secondary | ICD-10-CM

## 2020-08-14 DIAGNOSIS — O24419 Gestational diabetes mellitus in pregnancy, unspecified control: Secondary | ICD-10-CM | POA: Insufficient documentation

## 2020-08-14 MED ORDER — "INSULIN SYRINGE-NEEDLE U-100 30G X 5/16"" 1 ML MISC": 1.0000 [IU] | Freq: Three times a day (TID) | 3 refills | Status: DC

## 2020-08-14 MED ORDER — INSULIN NPH (HUMAN) (ISOPHANE) 100 UNIT/ML ~~LOC~~ SUSP: SUBCUTANEOUS | 3 refills | Status: DC

## 2020-08-14 MED ORDER — ALCOHOL WIPES 70 % EX MISC: 1.0000 [IU] | Freq: Four times a day (QID) | CUTANEOUS | 3 refills | Status: DC

## 2020-08-14 MED ORDER — INSULIN REGULAR HUMAN 100 UNIT/ML IJ SOLN: INTRAMUSCULAR | 11 refills | Status: DC

## 2020-08-14 NOTE — Progress Notes (Signed)
Prenatal Visit Note Date: 08/14/2020 Clinic: [facility]  Subjective:  Sara Fowler is a 35 y.o. [phone] at [redacted]w[redacted]d being seen today for ongoing prenatal care.  She is currently monitored for the following issues for this high-risk pregnancy and has Gestational diabetes mellitus (GDM), antepartum; Supervision of high risk pregnancy, antepartum; AMA (advanced maternal age) multigravida 35+; Language barrier; and Asymptomatic bacteriuria during pregnancy on their problem list.  Patient reports no complaints.   Contractions: Not present. Vag. Bleeding: None.  Movement: Present. Denies leaking of fluid.   The following portions of the patient's history were reviewed and updated as appropriate: allergies, current medications, past family history, past medical history, past social history, past surgical history and problem list. Problem list updated.  Objective:   Vitals:   08/14/20 1003  BP: 124/85  Pulse: 86  Weight: 227 lb 11.2 oz (103.3 kg)    Fetal Status: Fetal Heart Rate (bpm): 141   Movement: Present     General:  Alert, oriented and cooperative. Patient is in no acute distress.  Skin: Skin is warm and dry. No rash noted.   Cardiovascular: Normal heart rate noted  Respiratory: Normal respiratory effort, no problems with respiration noted  Abdomen: Soft, gravid, appropriate for gestational age. Pain/Pressure: Absent     Pelvic:  Cervical exam deferred        Extremities: Normal range of motion.  Edema: Trace  Mental Status: Normal mood and affect. Normal behavior. Normal judgment and thought content.   Urinalysis:      Assessment and Plan:  Pregnancy: G4P3003 at [redacted]w[redacted]d  1. Multigravida of advanced maternal age in third trimester No issues. Has rpt mfm u/s nv  2. Language barrier interpreter  3. Gestational diabetes mellitus (GDM), antepartum, gestational diabetes method of control unspecified AM fastings: mostly in the 110s-140s 2h PP: mostly in  the mid to high 100s and even low 200s I d/w her re: need for Antenatal testing; will try and add on for today I d/w her need for medication and I recommend insulin, which she is amenable to; she is self pay. I recommend and was sent in: Breakfast: NPH 34 units, Regular 18 units Supper: Regular 14 units Before bed: NPH 14 units  4. Supervision of high risk pregnancy, antepartum  Preterm labor symptoms and general obstetric precautions including but not limited to vaginal bleeding, contractions, leaking of fluid and fetal movement were reviewed in detail with the patient. Please refer to After Visit Summary for other counseling recommendations.  Return in about 1 week (around 08/21/2020) for in person, md visit.   Huxley Bing, MD

## 2020-08-14 NOTE — Progress Notes (Signed)
Insulin Instruction  Patient was seen on 08/14/20 for insulin instruction.  MD orders are:  NPH (Novolin N) 34 units with breakfast; 14 units before bedtime Humulin R 18 units with breakfast; 14 units with supper  The following learning objectives were met by the patient during this visit:   Insulin Action of NPH and Regular insulins  Reviewed syringe & vial including # units per syringe and vial  Hygiene and storage  Drawing up single and mixed doses if using vials   Single dose   Mixed dose  Rotation of Sites  Hypoglycemia- symptoms, causes, treatment choices  Record keeping and MD follow up  Patient demonstrated understanding of insulin administration by return demonstration.  Patient received the following handouts:  Insulin Instruction Handout                                        Patient to start on insulin as Rx'd by MD  Patient will be seen for follow-up in 2 weeks (soonest available appointment time).

## 2020-08-15 ENCOUNTER — Telehealth: Payer: Self-pay | Admitting: Registered"

## 2020-08-15 NOTE — Telephone Encounter (Signed)
Call made using [facility] [phone]  Follow-up call after Insulin instruction yesterday. Patient confirmed she injected 14 units of insulin last night and mixed NPH and R for injection before breakfast. Today patient reports FBS 157 mg/dL; 2 hrs post breakfast 462 mg/dL.  Pt states she has no questions about doing insulin injections. Patient asked if all her appointments and [facility] can be rescheduled to fall on Tuesdays since that is her day off. RD sent message to [facility].

## 2020-08-16 ENCOUNTER — Other Ambulatory Visit: Payer: Self-pay

## 2020-08-21 ENCOUNTER — Other Ambulatory Visit: Payer: Self-pay

## 2020-08-21 ENCOUNTER — Encounter: Payer: Self-pay | Admitting: *Deleted

## 2020-08-21 ENCOUNTER — Ambulatory Visit: Payer: Self-pay | Attending: Obstetrics

## 2020-08-21 ENCOUNTER — Ambulatory Visit: Payer: Self-pay | Admitting: *Deleted

## 2020-08-21 VITALS — BP 116/85 | HR 87

## 2020-08-21 DIAGNOSIS — O24414 Gestational diabetes mellitus in pregnancy, insulin controlled: Secondary | ICD-10-CM

## 2020-08-21 DIAGNOSIS — O0990 Supervision of high risk pregnancy, unspecified, unspecified trimester: Secondary | ICD-10-CM | POA: Insufficient documentation

## 2020-08-21 DIAGNOSIS — O24419 Gestational diabetes mellitus in pregnancy, unspecified control: Secondary | ICD-10-CM | POA: Insufficient documentation

## 2020-08-22 ENCOUNTER — Encounter: Payer: Self-pay | Admitting: Family Medicine

## 2020-08-22 ENCOUNTER — Telehealth: Payer: Self-pay | Admitting: Lactation Services

## 2020-08-22 ENCOUNTER — Other Ambulatory Visit: Payer: Self-pay | Admitting: Family Medicine

## 2020-08-22 ENCOUNTER — Ambulatory Visit (INDEPENDENT_AMBULATORY_CARE_PROVIDER_SITE_OTHER): Payer: Self-pay | Admitting: Family Medicine

## 2020-08-22 ENCOUNTER — Encounter: Payer: Self-pay | Admitting: Obstetrics and Gynecology

## 2020-08-22 ENCOUNTER — Other Ambulatory Visit: Payer: Self-pay

## 2020-08-22 ENCOUNTER — Other Ambulatory Visit: Payer: Self-pay | Admitting: *Deleted

## 2020-08-22 VITALS — BP 123/87 | HR 85 | Wt 231.9 lb

## 2020-08-22 DIAGNOSIS — O09529 Supervision of elderly multigravida, unspecified trimester: Secondary | ICD-10-CM

## 2020-08-22 DIAGNOSIS — O24414 Gestational diabetes mellitus in pregnancy, insulin controlled: Secondary | ICD-10-CM

## 2020-08-22 DIAGNOSIS — Z789 Other specified health status: Secondary | ICD-10-CM

## 2020-08-22 DIAGNOSIS — O0990 Supervision of high risk pregnancy, unspecified, unspecified trimester: Secondary | ICD-10-CM

## 2020-08-22 DIAGNOSIS — O99891 Other specified diseases and conditions complicating pregnancy: Secondary | ICD-10-CM

## 2020-08-22 DIAGNOSIS — O3660X0 Maternal care for excessive fetal growth, unspecified trimester, not applicable or unspecified: Secondary | ICD-10-CM | POA: Insufficient documentation

## 2020-08-22 DIAGNOSIS — R8271 Bacteriuria: Secondary | ICD-10-CM

## 2020-08-22 LAB — POCT URINALYSIS DIP (DEVICE)
Bilirubin Urine: NEGATIVE
Glucose, UA: NEGATIVE mg/dL
Hgb urine dipstick: NEGATIVE
Ketones, ur: NEGATIVE mg/dL
Leukocytes,Ua: NEGATIVE
Nitrite: NEGATIVE
Protein, ur: NEGATIVE mg/dL
Specific Gravity, Urine: >=1.03 (ref 1.005–1.030)
Urobilinogen, UA: 0.2 mg/dL (ref 0.0–1.0)
pH: 7 (ref 5.0–8.0)

## 2020-08-22 MED ORDER — INSULIN REGULAR HUMAN 100 UNIT/ML IJ SOLN: INTRAMUSCULAR | 11 refills | Status: DC

## 2020-08-22 MED ORDER — INSULIN NPH (HUMAN) (ISOPHANE) 100 UNIT/ML ~~LOC~~ SUSP: SUBCUTANEOUS | 3 refills | Status: DC

## 2020-08-22 NOTE — Progress Notes (Signed)
   Subjective:  Sara Fowler is a 35 y.o. G4P3003 at [redacted]w[redacted]d being seen today for ongoing prenatal care.  She is currently monitored for the following issues for this high-risk pregnancy and has Gestational diabetes mellitus (GDM), antepartum; Supervision of high risk pregnancy, antepartum; AMA (advanced maternal age) multigravida 35+; Language barrier; and Asymptomatic bacteriuria during pregnancy on their problem list.  Patient reports no complaints.  Contractions: Not present. Vag. Bleeding: None.  Movement: Present. Denies leaking of fluid.   The following portions of the patient's history were reviewed and updated as appropriate: allergies, current medications, past family history, past medical history, past social history, past surgical history and problem list. Problem list updated.  Objective:   Vitals:   08/22/20 0909  BP: 123/87  Pulse: 85  Weight: 231 lb 14.4 oz (105.2 kg)    Fetal Status: Fetal Heart Rate (bpm): 135   Movement: Present     General:  Alert, oriented and cooperative. Patient is in no acute distress.  Skin: Skin is warm and dry. No rash noted.   Cardiovascular: Normal heart rate noted  Respiratory: Normal respiratory effort, no problems with respiration noted  Abdomen: Soft, gravid, appropriate for gestational age. Pain/Pressure: Present     Pelvic: Vag. Bleeding: None     Cervical exam deferred        Extremities: Normal range of motion.     Mental Status: Normal mood and affect. Normal behavior. Normal judgment and thought content.   Urinalysis:      Assessment and Plan:  Pregnancy: G4P3003 at [redacted]w[redacted]d  1. Supervision of high risk pregnancy, antepartum BP and FHR normal Discussed contraception, plans on Depo[facility]Nexplanon at [facility]  2. Antepartum multigravida of advanced maternal age   3. Language barrier Spanish  4. Insulin controlled gestational diabetes mellitus (GDM) during pregnancy, antepartum Did not bring log with her today Per recall  highest fasting is 102, usually around 90 Post prandial highest has ben 125, 118-120 Reports insulin cost her $300 at [facility], she spent the money because it was for the baby rx sent to [facility], reassured patient will be much cheaper there Control appears to be close to adequate, she will send me a picture through mychart later today to determine if any adjustments are needed Last growth yesterday with EFW >99%, BPP 8/8, following w MFM for weekly antenatal testing  5. Asymptomatic bacteriuria during pregnancy TOC normal  Preterm labor symptoms and general obstetric precautions including but not limited to vaginal bleeding, contractions, leaking of fluid and fetal movement were reviewed in detail with the patient. Please refer to After Visit Summary for other counseling recommendations.  Return in 2 weeks (on 09/05/2020).   Venora Maples, MD

## 2020-08-22 NOTE — Patient Instructions (Signed)
 Eleccin del mtodo anticonceptivo Contraception Choices La anticoncepcin, o los mtodos anticonceptivos, hace referencia a los mtodos o dispositivos que evitan el embarazo. Mtodos hormonales Implante anticonceptivo Un implante anticonceptivo consiste en un tubo delgado de plstico que contiene una hormona que evita el embarazo. Es diferente de un dispositivo intrauterino (DIU). Un mdico lo inserta en la parte superior del brazo. Los implantes pueden ser eficaces durante un mximo de 3 aos. Inyecciones de progestina sola Las inyecciones de progestina sola contienen progestina, una forma sinttica de la hormona progesterona. Un mdico las administra cada 3 meses. Pldoras anticonceptivas Las pldoras anticonceptivas son pastillas que contienen hormonas que evitan el embarazo. Deben tomarse una vez al da, preferentemente a la misma hora cada da. Se necesita una receta para utilizar este mtodo anticonceptivo. Parche anticonceptivo El parche anticonceptivo contiene hormonas que evitan el embarazo. Se coloca en la piel, debe cambiarse una vez a la semana durante tres semanas y debe retirarse en la cuarta semana. Se necesita una receta para utilizar este mtodo anticonceptivo. Anillo vaginal Un anillo vaginal contiene hormonas que evitan el embarazo. Se coloca en la vagina durante tres semanas y se retira en la cuarta semana. Luego se repite el proceso con un anillo nuevo. Se necesita una receta para utilizar este mtodo anticonceptivo. Anticonceptivo de emergencia Los anticonceptivos de emergencia son mtodos para evitar un embarazo despus de tener sexo sin proteccin. Vienen en forma de pldora y pueden tomarse hasta 5 das despus de tener sexo. Funcionan mejor cuando se toman lo ms pronto posible luego de tener sexo. La mayora de los anticonceptivos de emergencia estn disponibles sin receta mdica. Este mtodo no debe utilizarse como el nico mtodo anticonceptivo.   Mtodos de  barrera Condn masculino Un condn masculino es una vaina delgada que se coloca sobre el pene durante el sexo. Los condones evitan que el esperma ingrese en el cuerpo de la mujer. Pueden utilizarse con un una sustancia que mata a los espermatozoides (espermicida) para aumentar la efectividad. Deben desecharse despus de un uso. Condn femenino Un condn femenino es una vaina blanda y holgada que se coloca en la vagina antes de tener sexo. El condn evita que el esperma ingrese en el cuerpo de la mujer. Deben desecharse despus de un uso. Diafragma Un diafragma es una barrera blanda con forma de cpula. Se inserta en la vagina antes del sexo, junto con un espermicida. El diafragma bloquea el ingreso de esperma en el tero, y el espermicida mata a los espermatozoides. El diafragma debe permanecer en la vagina durante 6 a 8 horas despus de tener sexo y debe retirarse en el plazo de las 24 horas. Un diafragma es recetado y colocado por un mdico. Debe reemplazarse cada 1 a 2 aos, despus de dar a luz, de aumentar ms de 15lb (6.8kg) y de una ciruga plvica. Capuchn cervical Un capuchn cervical es una copa redonda y blanda de ltex o plstico que se coloca en el cuello uterino. Se inserta en la vagina antes del sexo, junto con un espermicida. Bloquea el ingreso del esperma en el tero. El capuchn debe permanecer en el lugar durante 6 a 8 horas despus de tener sexo y debe retirarse en el plazo de las 48 horas. Un capuchn cervical debe ser recetado y colocado por un mdico. Debe reemplazarse cada 2aos. Esponja Una esponja es una pieza blanda y circular de espuma de poliuretano que contiene espermicida. La esponja ayuda a bloquear el ingreso de esperma en el tero, y el   espermicida mata a los espermatozoides. Para utilizarla, debe humedecerla e insertarla en la vagina. Debe insertarse antes de tener sexo, debe permanecer dentro al menos durante 6 horas despus de tener sexo y debe retirarse y  desecharse en el plazo de las 30 horas. Espermicidas Los espermicidas son sustancias qumicas que matan o bloquean al esperma y no lo dejan ingresar al cuello uterino y al tero. Vienen en forma de crema, gel, supositorio, espuma o comprimido. Un espermicida debe insertarse en la vagina con un aplicador al menos 10 o 15 minutos antes de tener sexo para dar tiempo a que surta efecto. El proceso debe repetirse cada vez que tenga sexo. Los espermicidas no requieren receta mdica.   Anticonceptivos intrauterinos Dispositivo intrauterino (DIU) Un DIU es un dispositivo en forma de T que se coloca en el tero. Existen dos tipos:  DIU hormonal.Este tipo contiene progestina, una forma sinttica de la hormona progesterona. Este tipo puede permanecer colocado durante 3 a 5 aos.  DIU de cobre.Este tipo est recubierto con un alambre de cobre. Puede permanecer colocado durante 10 aos. Mtodos anticonceptivos permanentes Ligadura de trompas en la mujer En este mtodo, se sellan, atan u obstruyen las trompas de Falopio durante una ciruga para evitar que el vulo descienda hacia el tero. Esterilizacin histeroscpica En este mtodo, se coloca un implante pequeo y flexible dentro de cada trompa de Falopio. Los implantes hacen que se forme un tejido cicatricial en las trompas de Falopio y que las obstruya para que el espermatozoide no pueda llegar al vulo. El procedimiento demora alrededor de 3 meses para que sea efectivo. Debe utilizarse otro mtodo anticonceptivo durante esos 3 meses. Esterilizacin masculina Este es un procedimiento que consiste en atar los conductos que transportan el esperma (vasectoma). Luego del procedimiento, el hombre puede eyacular lquido (semen). Debe utilizarse otro mtodo anticonceptivo durante 3 meses despus del procedimiento. Mtodos de planificacin natural Planificacin familiar natural En este mtodo, la pareja no tiene sexo durante los das en que la mujer podra quedar  embarazada. Mtodo calendario En este mtodo, la mujer realiza un seguimiento de la duracin de cada ciclo menstrual, identifica los das en los que se puede producir un embarazo y no tiene sexo durante esos das. Mtodo de la ovulacin En este mtodo, la pareja evita tener sexo durante la ovulacin. Mtodo sintotrmico Este mtodo implica no tener sexo durante la ovulacin. Normalmente, la mujer comprueba la ovulacin al observar cambios en su temperatura y en la consistencia del moco cervical. Mtodo posovulacin En este mtodo, la pareja espera a que finalice la ovulacin para tener sexo. Dnde buscar ms informacin  Centers for Disease Control and Prevention (Centros para el Control y la Prevencin de Enfermedades): www.cdc.gov Resumen  La anticoncepcin, o los mtodos anticonceptivos, hace referencia a los mtodos o dispositivos que evitan el embarazo.  Los mtodos anticonceptivos hormonales incluyen implantes, inyecciones, pastillas, parches, anillos vaginales y anticonceptivos de emergencia.  Los mtodos anticonceptivos de barrera pueden incluir condones masculinos, condones femeninos, diafragmas, capuchones cervicales, esponjas y espermicidas.  Existen dos tipos de DIU (dispositivo intrauterino). Un DIU puede colocarse en el tero de una mujer para evitar el embarazo durante 3 a 5 aos.  La esterilizacin permanente puede realizarse mediante un procedimiento tanto en los hombres como en las mujeres. Los mtodos de planificacin familiar natural implican no tener sexo durante los das en que la mujer podra quedar embarazada. Esta informacin no tiene como fin reemplazar el consejo del mdico. Asegrese de hacerle al mdico cualquier pregunta que   tenga. Document Revised: 01/03/2020 Document Reviewed: 01/03/2020 Elsevier Patient Education  2021 [employer].   Lactancia materna Breastfeeding  Decidir amamantar es una de las mejores elecciones que puede hacer por usted y su  beb. Un cambio en las hormonas durante el embarazo hace que las mamas produzcan leche materna en las glndulas productoras de leche. Las hormonas impiden que la leche materna sea liberada antes del nacimiento del beb. Adems, impulsan el flujo de leche luego del nacimiento. Una vez que ha comenzado a amamantar, pensar en el beb, as como la succin o el llanto, pueden estimular la liberacin de leche de las glndulas productoras de leche. Los beneficios de amamantar Las investigaciones demuestran que la lactancia materna ofrece muchos beneficios de salud para bebs y madres. Adems, ofrece una forma gratuita y conveniente de alimentar al beb. Para el beb  La primera leche (calostro) ayuda a mejorar el funcionamiento del aparato digestivo del beb.  Las clulas especiales de la leche (anticuerpos) ayudan a combatir las infecciones en el beb.  Los bebs que se alimentan con leche materna tambin tienen menos probabilidades de tener asma, alergias, obesidad o diabetes de tipo 2. Adems, tienen menor riesgo de sufrir el sndrome de muerte sbita del lactante (SMSL).  Los nutrientes de la leche materna son mejores para satisfacer las necesidades del beb en comparacin con la leche maternizada.  La leche materna mejora el desarrollo cerebral del beb. Para usted  La lactancia materna favorece el desarrollo de un vnculo muy especial entre la madre y el beb.  Es conveniente. La leche materna es econmica y siempre est disponible a la temperatura correcta.  La lactancia materna ayuda a quemar caloras. Le ayuda a perder el peso ganado durante el embarazo.  Hace que el tero vuelva al tamao que tena antes del embarazo ms rpido. Adems, disminuye el sangrado (loquios) despus del parto.  La lactancia materna contribuye a reducir el riesgo de tener diabetes de tipo 2, osteoporosis, artritis reumatoide, enfermedades cardiovasculares y cncer de mama, ovario, tero y endometrio en el  futuro. Informacin bsica sobre la lactancia Comienzo de la lactancia  Encuentre un lugar cmodo para sentarse o acostarse, con un buen respaldo para el cuello y la espalda.  Coloque una almohada o una manta enrollada debajo del beb para acomodarlo a la altura de la mama (si est sentada). Las almohadas para amamantar se han diseado especialmente a fin de servir de apoyo para los brazos y el beb mientras amamanta.  Asegrese de que la barriga del beb (abdomen) est frente a la suya.  Masajee suavemente la mama. Con las yemas de los dedos, masajee los bordes exteriores de la mama hacia adentro, en direccin al pezn. Esto estimula el flujo de leche. Si la leche fluye lentamente, es posible que deba continuar con este movimiento durante la lactancia.  Sostenga la mama con 4 dedos por debajo y el pulgar por arriba del pezn (forme la letra "C" con la mano). Asegrese de que los dedos se encuentren lejos del pezn y de la boca del beb.  Empuje suavemente los labios del beb con el pezn o con el dedo.  Cuando la boca del beb se abra lo suficiente, acrquelo rpidamente a la mama e introduzca todo el pezn y la arola, tanto como sea posible, dentro de la boca del beb. La arola es la zona de color que rodea al pezn. ? Debe haber ms arola visible por arriba del labio superior del beb que por debajo del labio   inferior. ? Los labios del beb deben estar abiertos y extendidos hacia afuera (evertidos) para asegurar que el beb se prenda de forma adecuada y cmoda. ? La lengua del beb debe estar entre la enca inferior y la mama.  Asegrese de que la boca del beb est en la posicin correcta alrededor del pezn (prendido). Los labios del beb deben crear un sello sobre la mama y estar doblados hacia afuera (invertidos).  Es comn que el beb succione durante 2 a 3 minutos para que comience el flujo de leche materna. Cmo debe prenderse Es muy importante que le ensee al beb cmo  prenderse adecuadamente a la mama. Si el beb no se prende adecuadamente, puede causar dolor en los pezones, reducir la produccin de leche materna y hacer que el beb tenga un escaso aumento de peso. Adems, si el beb no se prende adecuadamente al pezn, puede tragar aire durante la alimentacin. Esto puede causarle molestias al beb. Hacer eructar al beb al cambiar de mama puede ayudarlo a liberar el aire. Sin embargo, ensearle al beb cmo prenderse a la mama adecuadamente es la mejor manera de evitar que se sienta molesto por tragar aire mientras se alimenta. Signos de que el beb se ha prendido adecuadamente al pezn  Tironea o succiona de modo silencioso, sin causarle dolor. Los labios del beb deben estar extendidos hacia afuera (evertidos).  Se escucha que traga cada 3 o 4 succiones una vez que la leche ha comenzado a fluir (despus de que se produzca el reflejo de eyeccin de la leche).  Hay movimientos musculares por arriba y por delante de sus odos al succionar. Signos de que el beb no se ha prendido adecuadamente al pezn  Hace ruidos de succin o de chasquido mientras se alimenta.  Siente dolor en los pezones. Si cree que el beb no se prendi correctamente, deslice el dedo en la comisura de la boca y colquelo entre las encas del beb para interrumpir la succin. Intente volver a comenzar a amamantar. Signos de lactancia materna exitosa Signos del beb  El beb disminuir gradualmente el nmero de succiones o dejar de succionar por completo.  El beb se quedar dormido.  El cuerpo del beb se relajar.  El beb retendr una pequea cantidad de leche en la boca.  El beb se desprender solo del pecho. Signos que presenta usted  Las mamas han aumentado la firmeza, el peso y el tamao 1 a 3 horas despus de amamantar.  Estn ms blandas inmediatamente despus de amamantar.  Se producen un aumento del volumen de leche y un cambio en su consistencia y color hacia el  quinto da de lactancia.  Los pezones no duelen, no estn agrietados ni sangran. Signos de que su beb recibe la cantidad de leche suficiente  Mojar por lo menos 1 o 2paales durante las primeras 24horas despus del nacimiento.  Mojar por lo menos 5 o 6paales cada 24horas durante la primera semana despus del nacimiento. La orina debe ser clara o de color amarillo plido a los 5das de vida.  Mojar entre 6 y 8paales cada 24horas a medida que el beb sigue creciendo y desarrollndose.  Defeca por lo menos 3 veces en 24 horas a los 5 das de vida. Las heces deben ser blandas y amarillentas.  Defeca por lo menos 3 veces en 24 horas a los 7 das de vida. Las heces deben ser grumosas y amarillentas.  No registra una prdida de peso mayor al 10% del peso al   nacer durante los primeros 3 das de vida.  Aumenta de peso un promedio de 4 a 7onzas (113 a 198g) por semana despus de los 4 das de vida.  Aumenta de peso, diariamente, de manera uniforme a partir de los 5 das de vida, sin registrar prdida de peso despus de las 2semanas de vida. Despus de alimentarse, es posible que el beb regurgite una pequea cantidad de leche. Esto es normal. Frecuencia y duracin de la lactancia El amamantamiento frecuente la ayudar a producir ms leche y puede prevenir dolores en los pezones y las mamas extremadamente llenas (congestin mamaria). Alimente al beb cuando muestre signos de hambre o si siente la necesidad de reducir la congestin de las mamas. Esto se denomina "lactancia a demanda". Las seales de que el beb tiene hambre incluyen las siguientes:  Aumento del estado de alerta, actividad o inquietud.  Mueve la cabeza de un lado a otro.  Abre la boca cuando se le toca la mejilla o la comisura de la boca (reflejo de bsqueda).  Aumenta las vocalizaciones, tales como sonidos de succin, se relame los labios, emite arrullos, suspiros o chirridos.  Mueve la mano hacia la boca y se chupa  los dedos o las manos.  Est molesto o llora. Evite el uso del chupete en las primeras 4 a 6 semanas despus del nacimiento del beb. Despus de este perodo, podr usar un chupete. Las investigaciones demostraron que el uso del chupete durante el primer ao de vida del beb disminuye el riesgo de tener el sndrome de muerte sbita del lactante (SMSL). Permita que el nio se alimente en cada mama todo lo que desee. Cuando el beb se desprende o se queda dormido mientras se est alimentando de la primera mama, ofrzcale la segunda. Debido a que, con frecuencia, los recin nacidos estn somnolientos las primeras semanas de vida, es posible que deba despertar al beb para alimentarlo. Los horarios de lactancia varan de un beb a otro. Sin embargo, las siguientes reglas pueden servir como gua para ayudarla a garantizar que el beb se alimenta adecuadamente:  Se puede amamantar a los recin nacidos (bebs de 4 semanas o menos de vida) cada 1 a 3 horas.  No deben transcurrir ms de 3 horas durante el da o 5 horas durante la noche sin que se amamante a los recin nacidos.  Debe amamantar al beb un mnimo de 8 veces en un perodo de 24 horas. Extraccin de leche materna La extraccin y el almacenamiento de la leche materna le permiten asegurarse de que el beb se alimente exclusivamente de su leche materna, aun en momentos en los que no puede amamantar. Esto tiene especial importancia si debe regresar al trabajo en el perodo en que an est amamantando o si no puede estar presente en los momentos en que el beb debe alimentarse. Su asesor en lactancia puede ayudarla a encontrar un mtodo de extraccin que funcione mejor para usted y orientarla sobre cunto tiempo es seguro almacenar leche materna.      Cmo cuidar las mamas durante la lactancia Los pezones pueden secarse, agrietarse y doler durante la lactancia. Las siguientes recomendaciones pueden ayudarla a mantener las mamas humectadas y  sanas:  Evite usar jabn en los pezones.  Use un sostn de soporte diseado especialmente para la lactancia materna. Evite usar sostenes con aro o sostenes muy ajustados (sostenes deportivos).  Seque al aire sus pezones durante 3 a 4minutos despus de amamantar al beb.  Utilice solo apsitos de algodn en   el sostn para absorber las prdidas de leche. La prdida de un poco de leche materna entre las tomas es normal.  Utilice lanolina sobre los pezones luego de amamantar. La lanolina ayuda a mantener la humedad normal de la piel. La lanolina pura no es perjudicial (no es txica) para el beb. Adems, puede extraer manualmente algunas gotas de leche materna y masajear suavemente esa leche sobre los pezones para que la leche se seque al aire. Durante las primeras semanas despus del nacimiento, algunas mujeres experimentan congestin mamaria. La congestin mamaria puede hacer que sienta las mamas pesadas, calientes y sensibles al tacto. El pico de la congestin mamaria ocurre en el plazo de los 3 a 5 das despus del parto. Las siguientes recomendaciones pueden ayudarla a aliviar la congestin mamaria:  Vace por completo las mamas al amamantar o extraer leche. Puede aplicar calor hmedo en las mamas (en la ducha o con toallas hmedas para manos) antes de amamantar o extraer leche. Esto aumenta la circulacin y ayuda a que la leche fluya. Si el beb no vaca por completo las mamas cuando lo amamanta, extraiga la leche restante despus de que haya finalizado.  Aplique compresas de hielo sobre las mamas inmediatamente despus de amamantar o extraer leche, a menos que le resulte demasiado incmodo. Haga lo siguiente: ? Ponga el hielo en una bolsa plstica. ? Coloque una toalla entre la piel y la bolsa de hielo. ? Coloque el hielo durante 20minutos, 2 o 3veces por da.  Asegrese de que el beb est prendido y se encuentre en la posicin correcta mientras lo alimenta. Si la congestin mamaria  persiste luego de 48 horas o despus de seguir estas recomendaciones, comunquese con su mdico o un asesor en lactancia. Recomendaciones de salud general durante la lactancia  Consuma 3 comidas y 3 colaciones saludables todos los das. Las madres bien alimentadas que amamantan necesitan entre 450 y 500 caloras adicionales por da. Puede cumplir con este requisito al aumentar la cantidad de una dieta equilibrada que realice.  Beba suficiente agua para mantener la orina clara o de color amarillo plido.  Descanse con frecuencia, reljese y siga tomando sus vitaminas prenatales para prevenir la fatiga, el estrs y los niveles bajos de vitaminas y minerales en el cuerpo (deficiencias de nutrientes).  No consuma ningn producto que contenga nicotina o tabaco, como cigarrillos y cigarrillos electrnicos. El beb puede verse afectado por las sustancias qumicas de los cigarrillos que pasan a la leche materna y por la exposicin al humo ambiental del tabaco. Si necesita ayuda para dejar de fumar, consulte al mdico.  Evite el consumo de alcohol.  No consuma drogas ilegales o marihuana.  Antes de usar cualquier medicamento, hable con el mdico. Estos incluyen medicamentos recetados y de venta libre, como tambin vitaminas y suplementos a base de hierbas. Algunos medicamentos, que pueden ser perjudiciales para el beb, pueden pasar a travs de la leche materna.  Puede quedar embarazada durante la lactancia. Si se desea un mtodo anticonceptivo, consulte al mdico sobre cules son las opciones seguras durante la lactancia. Dnde encontrar ms informacin: Liga internacional La Leche: www.llli.org. Comunquese con un mdico si:  Siente que quiere dejar de amamantar o se siente frustrada con la lactancia.  Sus pezones estn agrietados o sangran.  Sus mamas estn irritadas, sensibles o calientes.  Tiene los siguientes sntomas: ? Dolor en las mamas o en los pezones. ? Un rea hinchada en cualquiera  de las mamas. ? Fiebre o escalofros. ? Nuseas o vmitos. ?   Drenaje de otro lquido distinto de la leche materna desde los pezones.  Sus mamas no se llenan antes de amamantar al beb para el quinto da despus del parto.  Se siente triste y deprimida.  El beb: ? Est demasiado somnoliento como para comer bien. ? Tiene problemas para dormir. ? Tiene ms de 1 semana de vida y moja menos de 6 paales en un periodo de 24 horas. ? No ha aumentado de peso a los 5 das de vida.  El beb defeca menos de 3 veces en 24 horas.  La piel del beb o las partes blancas de los ojos se vuelven amarillentas. Solicite ayuda de inmediato si:  El beb est muy cansado (letargo) y no se quiere despertar para comer.  Le sube la fiebre sin causa. Resumen  La lactancia materna ofrece muchos beneficios de salud para bebs y madres.  Intente amamantar a su beb cuando muestre signos tempranos de hambre.  Haga cosquillas o empuje suavemente los labios del beb con el dedo o el pezn para lograr que el beb abra la boca. Acerque el beb a la mama. Asegrese de que la mayor parte de la arola se encuentre dentro de la boca del beb. Ofrzcale una mama y haga eructar al beb antes de pasar a la otra.  Hable con su mdico o asesor en lactancia si tiene dudas o problemas con la lactancia. Esta informacin no tiene como fin reemplazar el consejo del mdico. Asegrese de hacerle al mdico cualquier pregunta que tenga. Document Revised: 08/27/2017 Document Reviewed: 09/22/2016 Elsevier Patient Education  2021 [employer] Inc.  

## 2020-08-22 NOTE — Telephone Encounter (Signed)
Received call form Pharmacist Ethan to clarify insulin orders. He would like to know if patient is to be on Humulin R and Humulin R simultaneously.   Called pharmacy back and noone answered. Will need to try again later.

## 2020-08-28 ENCOUNTER — Ambulatory Visit: Payer: Self-pay | Admitting: Registered"

## 2020-08-28 ENCOUNTER — Encounter: Payer: Self-pay | Admitting: Registered"

## 2020-08-28 ENCOUNTER — Other Ambulatory Visit: Payer: Self-pay

## 2020-08-28 DIAGNOSIS — O24419 Gestational diabetes mellitus in pregnancy, unspecified control: Secondary | ICD-10-CM

## 2020-08-28 NOTE — Progress Notes (Signed)
In-person Interpreter services provided by [employer] from [employer]  Patient was seen on 08/28/20 for follow-up assessment and education for Gestational Diabetes. EDD 10/09/20; [redacted]w[redacted]d. Pt states she was told during an Maternal Fetal appointment that she was further along than 34w.  Current insulin orders: Patient confirms that she is using as prescribed NPH 34 units with breakfast; 14 units at bedtime Regular 18 units with breakfast; 14 units with dinner   Per Dr. Crissie Reese patient is to increase dose as follows: NPH 37 units with breakfast; 20 units bedtime Regular 20 units with breakfast; 14 units with dinner  Patient is testing blood glucose as directed pre breakfast and 2 hours after each meal. Pt states the elevated numbers are when she eats bigger meals that might contain 5-6 tortillas Patient has not been exercising much due to colder weather. Pt also states she has stopped working.   The following learning objectives reviewed during follow-up visit:   Role of exercise in helping reduce FBS  Plan:  . Increase insulin dose as instructed   Patient instructed to monitor glucose levels: FBS: 60 - 95 mg/dl 2 hour: <403 mg/dl  Patient received the following handouts:  Insulin dose sheet in Spanish  Patient will be seen for follow-up as needed.

## 2020-08-29 NOTE — Telephone Encounter (Signed)
Called [facility], was not able to reach a Pharmacy staff member at this time.

## 2020-08-30 ENCOUNTER — Ambulatory Visit (INDEPENDENT_AMBULATORY_CARE_PROVIDER_SITE_OTHER): Payer: Self-pay

## 2020-08-30 ENCOUNTER — Ambulatory Visit (INDEPENDENT_AMBULATORY_CARE_PROVIDER_SITE_OTHER): Payer: Self-pay | Admitting: *Deleted

## 2020-08-30 ENCOUNTER — Other Ambulatory Visit: Payer: Self-pay

## 2020-08-30 ENCOUNTER — Ambulatory Visit (INDEPENDENT_AMBULATORY_CARE_PROVIDER_SITE_OTHER): Payer: Self-pay | Admitting: Obstetrics & Gynecology

## 2020-08-30 VITALS — BP 119/80 | HR 73 | Wt 236.3 lb

## 2020-08-30 DIAGNOSIS — O24414 Gestational diabetes mellitus in pregnancy, insulin controlled: Secondary | ICD-10-CM

## 2020-08-30 DIAGNOSIS — O3663X0 Maternal care for excessive fetal growth, third trimester, not applicable or unspecified: Secondary | ICD-10-CM

## 2020-08-30 DIAGNOSIS — Z3A34 34 weeks gestation of pregnancy: Secondary | ICD-10-CM

## 2020-08-30 LAB — GLUCOSE, CAPILLARY: Glucose-Capillary: 85 mg/dL (ref 70–99)

## 2020-08-30 NOTE — Patient Instructions (Signed)
Diabetes mellitus tipo&nbsp;1 o tipo&nbsp;2 durante el embarazo, diagnstico Type 1 or Type 2 Diabetes Mellitus During Pregnancy, Diagnosis La diabetes tipo 1 y la diabetes tipo2 (diabetes mellitus) son enfermedades a Air cabin crew. Si la diabetes se trata, es posible que ni usted ni el beb se vean afectados. Qu incrementa el riesgo? Cosas que aumentan el riesgo de diabetes tipo 1  Tener un gen que predispone a la diabetes tipo 1.  Vivir en un lugar con clima fro.  Haber tenido ciertos virus.  Tener ciertos problemas con el sistema que combate las enfermedades (sistema inmunitario). Cosas que aumentan el riesgo de diabetes tipo 2  Tener demasiado peso corporal.  Hacer poca actividad fsica.  Tener o haber tenido alguno de los siguientes: ? Un nivel de azcar en la sangre ms alto de lo normal con el transcurso del [place]. ? Un tipo de diabetes durante el embarazo. ? Una afeccin que causa la formacin de pequeos sacos llenos de lquido en los ovarios.  Usted es: ? [place] americana. ? Afroamericana. ? Hispana/latina. ? Asitica/islea del Pacfico. Cules son los signos o sntomas?  Tener ms sed o hambre.  Tener que orinar ms, o tener que orinar ms durante la noche.  Cambios en el peso que: ? Ocurren de repente. ? No se pueden explicar.  Enfermarse mucho.  Sentirse cansado o dbil.  Cambios en la forma de ver (visin). Es posible que vea las cosas borrosas.  Curacin lenta de cortes o moretones.  Hormigueo o prdida de la sensibilidad (adormecimiento) en las manos o los pies.  Manchas oscuras en la piel. Cmo se trata? Puede que necesite consultar a un experto en diabetes. Su mdico puede recomendarle lo siguiente:  Aplicarse insulina o tomar otros Web designer.  Controle su nivel de azcar en la sangre con frecuencia.  Seguir un plan de alimentacin elaborado por un experto en alimentacin (nutricionista).  Hacer ejercicio con  regularidad.  Encontrar formas de lidiar con Development worker, community.  Tomar medicamentos que la ayuden a [employer] problemas causados por la diabetes. El mdico fijar los objetivos del tratamiento para usted. En general, los resultados de los niveles de azcar en la sangre deben ser los siguientes:  Despus de no haber comido durante 8horas (despus de ayunar): igual o menor que 95mg /dl ( ).  Despus de las comidas: ? Una hora despus de una comida: igual o menor que 140mg /dl (8.4XLKG/M). ? Dos horas despus de una comida: igual o menor que 120mg /dl ( ).  Nivel de HbA1C: menos del 6%.   Siga estas instrucciones en su casa:  Use los medicamentos de venta libre y los recetados solamente como se lo haya indicado el mdico.  Mantenga un peso saludable durante el [place].  Cumpla con todas las visitas de seguimiento. Preguntas para hacerle al mdico  Es necesario que me rena con un experto en diabetes?  Dnde puedo encontrar un grupo de apoyo?  Qu equipos necesitar para cuidarme en casa?  Qu medicamentos necesito? Cundo debo tomarlos?  Con qu frecuencia debo controlar mi nivel de azcar en la sangre?  A qu nmero puedo llamar si tengo preguntas?  Cundo es mi prxima cita con el mdico? Dnde buscar ms informacin  American Diabetes Association (Asociacin Estadounidense de la Diabetes): diabetes.org  Association of Diabetes Care & Education Specialists (Asociacin de Especialistas en Atencin y Educacin sobre la Diabetes): diabeteseducator.org  International Diabetes Federation (Federacin Internacional de Diabetes): Comunquese con un mdico si:  Su nivel de 3.6UYQI/H  es igual o mayor que 240mg /dl ( ).  Ha estado enferma o ha tenido fiebre durante ms de 86,5HQIO/NG y no [place].  Tiene alguno de estos problemas durante ms de 6horas: ? No puede comer ni beber. ? Tiene ganas de vomitar y vomita. ? Tiene  diarrea. Solicite ayuda de inmediato si:  Tiene un nivel muy bajo de azcar en la sangre (hipoglucemia grave). Esto significa que su nivel de azcar en la sangre est por debajo de 54mg /dl (3mmol/l).  Se siente desorientada (confundida).  Tiene dificultad para pensar o respirar.  El beb se mueve menos de lo normal.  Le sale lquido o sangre de la vagina.  Empieza a tener contracciones antes de [place]. Estas pueden causar una sensacin de opresin en la parte baja del vientre. Estos sntomas pueden 1m. Solicite ayuda de inmediato. Comunquese con el servicio de emergencias de su localidad (911 en los [place]).  No espere a ver si los sntomas desaparecen.  No conduzca por sus propios medios [facility]. Resumen  La diabetes tipo 1 y la diabetes tipo2 son enfermedades a Customer service manager.  Si la diabetes se trata, es posible que ni usted ni el beb se vean afectados.  El tratamiento puede incluir medicamentos, un plan de alimentacin, ejercicio y lidiar con [employer].  Controle su nivel de azcar en la sangre con frecuencia. El mdico fijar los objetivos del tratamiento para usted. Esta informacin no tiene Air cabin crew el consejo del mdico. Asegrese de hacerle al mdico cualquier pregunta que tenga. Document Revised: 02/08/2020 Document Reviewed: 02/08/2020 Elsevier Patient Education  2021 02/10/2020.

## 2020-08-30 NOTE — Progress Notes (Signed)
   PRENATAL VISIT NOTE  Subjective:  Sara Fowler is a 35 y.o. G4P3003 at [redacted]w[redacted]d being seen today for ongoing prenatal care.  She is currently monitored for the following issues for this high-risk pregnancy and has Gestational diabetes mellitus (GDM), antepartum; Supervision of high risk pregnancy, antepartum; AMA (advanced maternal age) multigravida 35+; Language barrier; Asymptomatic bacteriuria during pregnancy; and LGA (large for gestational age) fetus affecting management of mother on their problem list.  Patient reports no complaints.  Contractions: Not present. Vag. Bleeding: None.  Movement: Present. Denies leaking of fluid.   The following portions of the patient's history were reviewed and updated as appropriate: allergies, current medications, past family history, past medical history, past social history, past surgical history and problem list.   Objective:   Vitals:   08/30/20 1056  BP: 119/80  Pulse: 73  Weight: 236 lb 4.8 oz (107.2 kg)    Fetal Status: Fetal Heart Rate (bpm): 145   Movement: Present     General:  Alert, oriented and cooperative. Patient is in no acute distress.  Skin: Skin is warm and dry. No rash noted.   Cardiovascular: Normal heart rate noted  Respiratory: Normal respiratory effort, no problems with respiration noted  Abdomen: Soft, gravid, appropriate for gestational age.  Pain/Pressure: Present     Pelvic: Cervical exam deferred        Extremities: Normal range of motion.  Edema: Trace  Mental Status: Normal mood and affect. Normal behavior. Normal judgment and thought content.   Assessment and Plan:  Pregnancy: G4P3003 at [redacted]w[redacted]d 1. Insulin controlled gestational diabetes mellitus (GDM) during pregnancy, antepartum 90 % in range with some dietary indiscretion noted, discussed diet  2. Excessive fetal growth affecting management of pregnancy in third trimester, single or unspecified fetus F/u US  Preterm labor symptoms and general  obstetric precautions including but not limited to vaginal bleeding, contractions, leaking of fluid and fetal movement were reviewed in detail with the patient. Please refer to After Visit Summary for other counseling recommendations.   No follow-ups on file.  Future Appointments  Date Time Provider Department Center  08/30/2020 12:15 PM [facility] US1 [facility]  09/04/2020  2:00 PM [facility] NURSE [facility]  09/04/2020  2:15 PM [facility] US2 [facility]  09/11/2020  1:30 PM [facility] NURSE [facility]  09/11/2020  1:45 PM [facility] US4 [facility]    Scheryl Darter, MD

## 2020-08-30 NOTE — Progress Notes (Signed)
Spanish Interpreter Eda R. 

## 2020-09-04 ENCOUNTER — Ambulatory Visit: Payer: Self-pay

## 2020-09-04 ENCOUNTER — Other Ambulatory Visit: Payer: Self-pay

## 2020-09-06 ENCOUNTER — Other Ambulatory Visit: Payer: Self-pay

## 2020-09-06 ENCOUNTER — Ambulatory Visit (INDEPENDENT_AMBULATORY_CARE_PROVIDER_SITE_OTHER): Payer: Self-pay | Admitting: *Deleted

## 2020-09-06 ENCOUNTER — Ambulatory Visit (INDEPENDENT_AMBULATORY_CARE_PROVIDER_SITE_OTHER): Payer: Self-pay

## 2020-09-06 ENCOUNTER — Encounter: Payer: Self-pay | Admitting: Obstetrics and Gynecology

## 2020-09-06 ENCOUNTER — Ambulatory Visit (INDEPENDENT_AMBULATORY_CARE_PROVIDER_SITE_OTHER): Payer: Self-pay | Admitting: Obstetrics and Gynecology

## 2020-09-06 VITALS — BP 125/84 | HR 87 | Wt 237.2 lb

## 2020-09-06 DIAGNOSIS — O24414 Gestational diabetes mellitus in pregnancy, insulin controlled: Secondary | ICD-10-CM

## 2020-09-06 DIAGNOSIS — O0990 Supervision of high risk pregnancy, unspecified, unspecified trimester: Secondary | ICD-10-CM

## 2020-09-06 DIAGNOSIS — O09529 Supervision of elderly multigravida, unspecified trimester: Secondary | ICD-10-CM

## 2020-09-06 DIAGNOSIS — O3663X0 Maternal care for excessive fetal growth, third trimester, not applicable or unspecified: Secondary | ICD-10-CM

## 2020-09-06 DIAGNOSIS — Z789 Other specified health status: Secondary | ICD-10-CM

## 2020-09-06 NOTE — Progress Notes (Signed)
Subjective:  Sara Fowler is a 35 y.o. G4P3003 at [redacted]w[redacted]d being seen today for ongoing prenatal care.  She is currently monitored for the following issues for this high-risk pregnancy and has Gestational diabetes mellitus (GDM), antepartum; Supervision of high risk pregnancy, antepartum; AMA (advanced maternal age) multigravida 35+; Language barrier; Asymptomatic bacteriuria during pregnancy; and LGA (large for gestational age) fetus affecting management of mother on their problem list.  Patient reports general discomforts of pregnancy.  Contractions: Irritability. Vag. Bleeding: None.  Movement: Present. Denies leaking of fluid.   The following portions of the patient's history were reviewed and updated as appropriate: allergies, current medications, past family history, past medical history, past social history, past surgical history and problem list. Problem list updated.  Objective:   Vitals:   09/06/20 1409  BP: 125/84  Pulse: 87  Weight: 237 lb 3.2 oz (107.6 kg)    Fetal Status: Fetal Heart Rate (bpm): 158   Movement: Present     General:  Alert, oriented and cooperative. Patient is in no acute distress.  Skin: Skin is warm and dry. No rash noted.   Cardiovascular: Normal heart rate noted  Respiratory: Normal respiratory effort, no problems with respiration noted  Abdomen: Soft, gravid, appropriate for gestational age. Pain/Pressure: Present     Pelvic:  Cervical exam deferred        Extremities: Normal range of motion.  Edema: Trace  Mental Status: Normal mood and affect. Normal behavior. Normal judgment and thought content.   Urinalysis:      Assessment and Plan:  Pregnancy: G4P3003 at [redacted]w[redacted]d  1. Supervision of high risk pregnancy, antepartum Stable GBS next visit  2. Insulin controlled gestational diabetes mellitus (GDM) during pregnancy, antepartum CBG's in goal range Continue with current insulin management BPP/NSt today Continue with weekly antenatal  testing  3. Antepartum multigravida of advanced maternal age Stable  4. Excessive fetal growth affecting management of pregnancy in third trimester, single or unspecified fetus Growth scan next week IOL to be determine by EFW   5. Language barrier Live interrupter used during today's visit  Preterm labor symptoms and general obstetric precautions including but not limited to vaginal bleeding, contractions, leaking of fluid and fetal movement were reviewed in detail with the patient. Please refer to After Visit Summary for other counseling recommendations.  Return in about 1 week (around 09/13/2020) for OB visit, face to face, MD only, continue with weekly antenatal testing.   Hermina Staggers, MD

## 2020-09-10 ENCOUNTER — Other Ambulatory Visit: Payer: Self-pay

## 2020-09-11 ENCOUNTER — Ambulatory Visit: Payer: Self-pay

## 2020-09-11 MED FILL — ?HUMULIN R 100 UNITS/ML VIA: 100 | 31 days supply | Qty: 10 | Fill #0

## 2020-09-11 MED FILL — ?HUMULIN N 100 UNITS/ML VIA: 100 | 20 days supply | Qty: 10 | Fill #0

## 2020-09-13 ENCOUNTER — Other Ambulatory Visit: Payer: Self-pay

## 2020-09-13 ENCOUNTER — Ambulatory Visit: Payer: Self-pay | Admitting: *Deleted

## 2020-09-13 ENCOUNTER — Encounter: Payer: Self-pay | Admitting: *Deleted

## 2020-09-13 ENCOUNTER — Ambulatory Visit: Payer: Self-pay | Attending: Obstetrics

## 2020-09-13 ENCOUNTER — Encounter: Payer: Self-pay | Admitting: Family Medicine

## 2020-09-13 ENCOUNTER — Other Ambulatory Visit (HOSPITAL_COMMUNITY)
Admission: RE | Admit: 2020-09-13 | Discharge: 2020-09-13 | Disposition: A | Discharge status: Home / Self Care | Payer: Self-pay | Source: Ambulatory Visit | Attending: Family Medicine | Admitting: Family Medicine

## 2020-09-13 ENCOUNTER — Ambulatory Visit: Payer: Self-pay

## 2020-09-13 ENCOUNTER — Ambulatory Visit (INDEPENDENT_AMBULATORY_CARE_PROVIDER_SITE_OTHER): Payer: Self-pay | Admitting: Family Medicine

## 2020-09-13 VITALS — BP 130/69 | HR 82

## 2020-09-13 VITALS — BP 125/86 | HR 80 | Wt 240.3 lb

## 2020-09-13 DIAGNOSIS — Z794 Long term (current) use of insulin: Secondary | ICD-10-CM

## 2020-09-13 DIAGNOSIS — O0990 Supervision of high risk pregnancy, unspecified, unspecified trimester: Secondary | ICD-10-CM | POA: Insufficient documentation

## 2020-09-13 DIAGNOSIS — O09523 Supervision of elderly multigravida, third trimester: Secondary | ICD-10-CM

## 2020-09-13 DIAGNOSIS — Z3A37 37 weeks gestation of pregnancy: Secondary | ICD-10-CM

## 2020-09-13 DIAGNOSIS — Z789 Other specified health status: Secondary | ICD-10-CM

## 2020-09-13 DIAGNOSIS — O24414 Gestational diabetes mellitus in pregnancy, insulin controlled: Secondary | ICD-10-CM

## 2020-09-13 DIAGNOSIS — O98513 Other viral diseases complicating pregnancy, third trimester: Secondary | ICD-10-CM

## 2020-09-13 DIAGNOSIS — O3663X0 Maternal care for excessive fetal growth, third trimester, not applicable or unspecified: Secondary | ICD-10-CM

## 2020-09-13 DIAGNOSIS — Z758 Other problems related to medical facilities and other health care: Secondary | ICD-10-CM

## 2020-09-13 DIAGNOSIS — U071 COVID-19: Secondary | ICD-10-CM

## 2020-09-13 NOTE — Progress Notes (Signed)
   PRENATAL VISIT NOTE  Subjective:  Sara Fowler is a 35 y.o. G4P3003 at [redacted]w[redacted]d being seen today for ongoing prenatal care.  She is currently monitored for the following issues for this high-risk pregnancy and has Gestational diabetes mellitus (GDM), antepartum; Supervision of high risk pregnancy, antepartum; AMA (advanced maternal age) multigravida 35+; Language barrier; Asymptomatic bacteriuria during pregnancy; and LGA (large for gestational age) fetus affecting management of mother on their problem list.  Patient reports backache.  Contractions: Not present. Vag. Bleeding: None.  Movement: Present. Denies leaking of fluid.   The following portions of the patient's history were reviewed and updated as appropriate: allergies, current medications, past family history, past medical history, past social history, past surgical history and problem list.   Objective:   Vitals:   09/13/20 1545  BP: 125/86  Pulse: 80  Weight: 240 lb 4.8 oz (109 kg)    Fetal Status: Fetal Heart Rate (bpm): 138 Fundal Height: 39 cm Movement: Present  Presentation: Vertex  General:  Alert, oriented and cooperative. Patient is in no acute distress.  Skin: Skin is warm and dry. No rash noted.   Cardiovascular: Normal heart rate noted  Respiratory: Normal respiratory effort, no problems with respiration noted  Abdomen: Soft, gravid, appropriate for gestational age.  Pain/Pressure: Absent     Pelvic: Cervical exam performed in the presence of a chaperone Dilation: Fingertip Effacement (%): Thick Station: -3  Extremities: Normal range of motion.  Edema: Mild pitting, slight indentation  Mental Status: Normal mood and affect. Normal behavior. Normal judgment and thought content.   Assessment and Plan:  Pregnancy: G4P3003 at [redacted]w[redacted]d 1. Supervision of high risk pregnancy, antepartum UTD Desires depo for contracpetion Reinforced importance of kick counts.  - Culture, beta strep (group b only) - GC/Chlamydia  probe amp ([place])not at [facility]  2. Excessive fetal growth affecting management of pregnancy in third trimester, single or unspecified fetus [place] today showed 99th% (4055g) Had BPP today, next in 1 week IOL scheduled for 38 wks- LD called RN filling paperwork.  Reviewed insulin and sugars are now better controlled Proven to "high 8 lb" but does not know exact weight  3. Insulin controlled gestational diabetes mellitus (GDM) during pregnancy, antepartum No changes in medications today, improved overall and more compliant to goals (see pharmacist note) Continue current Insulin NPH 37 QAM 20u QPM with 14 units regular insulin with meals.  Reviewed recommendation for delivery at 52 by MFM and patient in agreement IOL requested today   4. Language barrier  5. Multigravida of advanced maternal age in third trimester   Preterm labor symptoms and general obstetric precautions including but not limited to vaginal bleeding, contractions, leaking of fluid and fetal movement were reviewed in detail with the patient. Please refer to After Visit Summary for other counseling recommendations.   Return in about 1 week (around 09/20/2020) for Routine prenatal care, MD only- eckstat if possible. .  Future Appointments  Date Time Provider Department Center  09/20/2020  8:15 AM [facility] NST [facility]  09/20/2020  9:15 AM  Bing, MD [facility]  09/24/2020 10:25 AM [facility]-SCREENING [facility] None  09/25/2020 12:00 AM [facility]-LD SCHED ROOM [facility]INDC None    Federico Flake, MD

## 2020-09-13 NOTE — Progress Notes (Signed)
Reviewed patients medications. She confirms taking NPH 37 units qAM and 20 units qPM as well as regular insulin 20 units with breakfast and 14 units with supper. She has not had any issues and denies signs/symptoms of hypoglycemia.   FBG are 80-100 (73% in range), PPBG are 86-125 (88% within range). She mentions that she reduced sweet intake after BG were "bad" a couple weeks ago. Her BG would She mentions that MFM told her that her baby was big (~8 lb now) and would likely deliver within 2 weeks.   Would recommend continuing current insulin regimen.  [employer]pre[employer] [place]

## 2020-09-14 ENCOUNTER — Telehealth (HOSPITAL_COMMUNITY): Payer: Self-pay | Admitting: *Deleted

## 2020-09-14 LAB — GC/CHLAMYDIA PROBE AMP (~~LOC~~) NOT AT ARMC
Chlamydia: NEGATIVE
Comment: NEGATIVE
Comment: NORMAL
Neisseria Gonorrhea: NEGATIVE

## 2020-09-14 NOTE — Telephone Encounter (Signed)
Interpreter number [phone] Preadmission screen

## 2020-09-17 ENCOUNTER — Encounter: Payer: Self-pay | Admitting: Obstetrics and Gynecology

## 2020-09-17 ENCOUNTER — Other Ambulatory Visit (HOSPITAL_COMMUNITY): Payer: Self-pay | Admitting: Advanced Practice Midwife

## 2020-09-17 ENCOUNTER — Encounter: Payer: Self-pay | Admitting: General Practice

## 2020-09-18 ENCOUNTER — Encounter: Payer: Self-pay | Admitting: Family Medicine

## 2020-09-18 ENCOUNTER — Telehealth (HOSPITAL_COMMUNITY): Payer: Self-pay | Admitting: *Deleted

## 2020-09-18 ENCOUNTER — Encounter (HOSPITAL_COMMUNITY): Payer: Self-pay | Admitting: *Deleted

## 2020-09-18 LAB — CULTURE, BETA STREP (GROUP B ONLY): Strep Gp B Culture: NEGATIVE

## 2020-09-18 NOTE — Telephone Encounter (Signed)
interprter number [phone]

## 2020-09-20 ENCOUNTER — Encounter: Payer: Self-pay | Admitting: Obstetrics and Gynecology

## 2020-09-20 ENCOUNTER — Ambulatory Visit (INDEPENDENT_AMBULATORY_CARE_PROVIDER_SITE_OTHER): Payer: Self-pay | Admitting: Obstetrics and Gynecology

## 2020-09-20 ENCOUNTER — Ambulatory Visit: Payer: Self-pay | Admitting: *Deleted

## 2020-09-20 ENCOUNTER — Ambulatory Visit (INDEPENDENT_AMBULATORY_CARE_PROVIDER_SITE_OTHER): Payer: Self-pay

## 2020-09-20 ENCOUNTER — Other Ambulatory Visit: Payer: Self-pay

## 2020-09-20 VITALS — BP 137/80 | HR 75 | Wt 240.1 lb

## 2020-09-20 DIAGNOSIS — O24414 Gestational diabetes mellitus in pregnancy, insulin controlled: Secondary | ICD-10-CM

## 2020-09-20 DIAGNOSIS — O0990 Supervision of high risk pregnancy, unspecified, unspecified trimester: Secondary | ICD-10-CM

## 2020-09-20 DIAGNOSIS — Z758 Other problems related to medical facilities and other health care: Secondary | ICD-10-CM

## 2020-09-20 DIAGNOSIS — O3663X0 Maternal care for excessive fetal growth, third trimester, not applicable or unspecified: Secondary | ICD-10-CM

## 2020-09-20 DIAGNOSIS — O09523 Supervision of elderly multigravida, third trimester: Secondary | ICD-10-CM

## 2020-09-20 DIAGNOSIS — Z789 Other specified health status: Secondary | ICD-10-CM

## 2020-09-20 NOTE — Progress Notes (Signed)
IOL scheduled 4/12 @ midnight

## 2020-09-20 NOTE — Progress Notes (Signed)
PRENATAL VISIT NOTE  Subjective:  Sara Fowler is a 35 y.o. G4P3003 at [redacted]w[redacted]d being seen today for ongoing prenatal care.  She is currently monitored for the following issues for this high-risk pregnancy and has Gestational diabetes requiring insulin; Supervision of high risk pregnancy, antepartum; AMA (advanced maternal age) multigravida 35+; Language barrier; Asymptomatic bacteriuria during pregnancy; and LGA (large for gestational age) fetus affecting management of mother on their problem list.  Patient reports no complaints.  Contractions: Irregular. Vag. Bleeding: None.  Movement: Present. Denies leaking of fluid.   The following portions of the patient's history were reviewed and updated as appropriate: allergies, current medications, past family history, past medical history, past social history, past surgical history and problem list.   Objective:   Vitals:   09/20/20 0903  BP: 137/80  Pulse: 75  Weight: 240 lb 1.6 oz (108.9 kg)    Fetal Status: Fetal Heart Rate (bpm): NST   Movement: Present     General:  Alert, oriented and cooperative. Patient is in no acute distress.  Skin: Skin is warm and dry. No rash noted.   Cardiovascular: Normal heart rate noted  Respiratory: Normal respiratory effort, no problems with respiration noted  Abdomen: Soft, gravid, appropriate for gestational age.  Pain/Pressure: Present     Pelvic: Cervical exam deferred        Extremities: Normal range of motion.     Mental Status: Normal mood and affect. Normal behavior. Normal judgment and thought content.   Assessment and Plan:  Pregnancy: G4P3003 at [redacted]w[redacted]d 1. Supervision of high risk pregnancy, antepartum Routine care  2. Insulin controlled gestational diabetes mellitus (GDM) during pregnancy, antepartum Patient on NPH 37/20 and regular 14 with her NPH 20 dose. Sugars look good with a few just above 120. I told her to watch her diet. BPP 10/10. See below. Continue current regimen  3.  Multigravida of advanced maternal age in third trimester No issues  4. Language barrier Interpreter used  5. Excessive fetal growth affecting management of pregnancy in third trimester, single or unspecified fetus Patient on 3/31 was >99% 4055gm, AC >99% AFI 15 and mfm states okay for delivery at 38wks. She is set up for an IOL on 4/12. Her largest prior is 8 to 8.5lbs (pt not exactly sure) and no issues with that delivery. I told her that based on her 3/31 u/s that she would be very close to 4500gm at 38wks. I d/w her re: shoulder dystocia risk and based data is need for 4-500 c-sections to prevent one brachial plexus injury. I told her that she has a risk of shoulder dystocia and other issues such as asphyxia and maternal complications with a shoulder dystocia and I also d/w her re: risks of c-section too. I told her that she can choose whichever option as long as she is okay with the risks associated with each. Patient to consider and will bring her back tomorrow.   6. Gestational diabetes requiring insulin  Term labor symptoms and general obstetric precautions including but not limited to vaginal bleeding, contractions, leaking of fluid and fetal movement were reviewed in detail with the patient. Please refer to After Visit Summary for other counseling recommendations.   Return in 1 day (on 09/21/2020) for [facility] w/Aleya Durnell - ok for overbook.  Future Appointments  Date Time Provider Department Center  09/20/2020  9:35 AM [facility] US[facility]  09/21/2020  8:20 AM [place] Bing, MD [facility]  09/24/2020 10:25 AM [facility]-SCREENING [facility] None  09/25/2020 12:00 AM [facility]-LD SCHED ROOM [facility]INDC None    [place] Bing, MD

## 2020-09-21 ENCOUNTER — Ambulatory Visit (INDEPENDENT_AMBULATORY_CARE_PROVIDER_SITE_OTHER): Payer: Self-pay | Admitting: Obstetrics and Gynecology

## 2020-09-21 ENCOUNTER — Encounter (HOSPITAL_COMMUNITY): Payer: Self-pay

## 2020-09-21 ENCOUNTER — Telehealth (HOSPITAL_COMMUNITY): Payer: Self-pay | Admitting: *Deleted

## 2020-09-21 VITALS — BP 138/82 | Wt 240.6 lb

## 2020-09-21 DIAGNOSIS — Z3A37 37 weeks gestation of pregnancy: Secondary | ICD-10-CM

## 2020-09-21 DIAGNOSIS — O133 Gestational [pregnancy-induced] hypertension without significant proteinuria, third trimester: Secondary | ICD-10-CM | POA: Insufficient documentation

## 2020-09-21 NOTE — Progress Notes (Signed)
Spanish Interpreter Raquel M.  

## 2020-09-21 NOTE — Pre-Procedure Instructions (Signed)
[phone] interpreter number

## 2020-09-21 NOTE — Telephone Encounter (Signed)
Preadmission screen  

## 2020-09-21 NOTE — Progress Notes (Signed)
    PRENATAL VISIT NOTE  Subjective:  Sara Fowler is a 35 y.o. G4P3003 at [redacted]w[redacted]d being seen today for ongoing prenatal care.  She is currently monitored for the following issues for this high-risk pregnancy and has Gestational diabetes requiring insulin; Supervision of high risk pregnancy, antepartum; AMA (advanced maternal age) multigravida 35+; Language barrier; Asymptomatic bacteriuria during pregnancy; LGA (large for gestational age) fetus affecting management of mother; and Transient hypertension of pregnancy in third trimester on their problem list.  Patient reports no complaints.  Contractions: Irregular. Vag. Bleeding: None.  Movement: Present. Denies leaking of fluid.   The following portions of the patient's history were reviewed and updated as appropriate: allergies, current medications, past family history, past medical history, past social history, past surgical history and problem list.   Objective:   Vitals:   09/21/20 0842 09/21/20 0857  BP: (!) 132/97 138/82  Weight: 240 lb 9.6 oz (109.1 kg)     Fetal Status: Fetal Heart Rate (bpm): 126   Movement: Present     General:  Alert, oriented and cooperative. Patient is in no acute distress.  Skin: Skin is warm and dry. No rash noted.   Cardiovascular: Normal heart rate noted  Respiratory: Normal respiratory effort, no problems with respiration noted  Abdomen: Soft, gravid, appropriate for gestational age.  Pain/Pressure: Present     Pelvic: Cervical exam deferred        Extremities: Normal range of motion.  Edema: Mild pitting, slight indentation  Mental Status: Normal mood and affect. Normal behavior. Normal judgment and thought content.  Normal s1 and s2, no MRGs CTAB  Assessment and Plan:  Pregnancy: G4P3003 at [redacted]w[redacted]d 1. Transient hypertension of pregnancy in third trimester Repeat BP negative. Pre-x precautions given. RTC Monday for BP check   2. LGA with DM2 Patient has decided on primary c-section. OR  called and pt scheduled for 4/13 at 1pm I forgot to ask but can ask patient about BTL at time of surgery. She is self pay  Interpreter used  Term labor symptoms and general obstetric precautions including but not limited to vaginal bleeding, contractions, leaking of fluid and fetal movement were reviewed in detail with the patient. Please refer to After Visit Summary for other counseling recommendations.   Return in about 3 days (around 09/24/2020) for in person, bp check, rn visit.  Future Appointments  Date Time Provider Department Center  09/24/2020  8:30 AM [facility] NURSE [facility]  09/24/2020 10:25 AM [facility]-SCREENING [facility] None    [place] Bing, MD

## 2020-09-21 NOTE — Patient Instructions (Signed)
Instrucciones Para Antes de la Ciruga   Su ciruga est programada para 09/26/2020  (your procedure is scheduled on) Entre por la entrada principal del [facility]  a las [address] [place] -(enter through the main entrance at [facility] at [employer] AM    [facility] telfono, [place] [address] para informarnos de su llegada. (pick up phone, dial [phone] on arrival)     Por favor llame al [phone] si tiene algn problema en la maana de la ciruga (please call this number if you have any problems the morning of surgery.)                  Recuerde: (Remember)  No coma alimentos. (Do not eat food (After Midnight) Desps de medianoche)    No tome lquidos claros. (Do not drink clear liquids (After Midnight) Desps de medianoche)    No use joyas, maquillaje de ojos, lpiz labial, crema para el cuerpo o esmalte de uas oscuro. (Do not wear jewelry, eye makeup, lipstick, body lotion, or dark fingernail polish). Puede usar desodorante (you may wear deodorant)    No se afeite 48 horas de su ciruga. (Do not shave 48 hours before your surgery)    No traiga objetos de valor al hospital.  Arial no se hace responsable de ninguna pertenencia, ni objetos de valor que haya trado al hospital. (Do not bring valuable to the hospital.  [place] is not responsible for any belongings or valuables brought to the hospital)   [facility] medicinas en la maana de la ciruga con un SORBITO de agua 10 units of NPH insulin the night before the surgery  (take these meds the morning of surgery with a SIP of water)     Durante la ciruga no se pueden usar lentes de contacto, dentaduras o puentes. (Contacts, dentures or bridgework cannot be worn in surgery).   Si va a ser ingresado despus de la ciruga, deje la [employer] en el carro hasta que se le haya asignado una habitacin. (If you are to be admitted after surgery, leave suitcase in car until your room has been  assigned.)   A los pacientes que se les d de alta el mismo da no se les permitir manejar a casa.  (Patients discharged on the day of surgery will not be allowed to drive home)    [place] y nmero de telfono del Programmer, multimedia na. (Name and telephone number of your driver)   Instrucciones especiales N/A (Special Instructions)   Por favor, lea las hojas informativas que le entregaron. (Please read over the following fact sheets that you were given) Surgical Site Infection Prevention

## 2020-09-21 NOTE — Pre-Procedure Instructions (Signed)
[phone] interpreter number

## 2020-09-24 ENCOUNTER — Other Ambulatory Visit: Payer: Self-pay

## 2020-09-24 ENCOUNTER — Encounter: Payer: Self-pay | Admitting: Lactation Services

## 2020-09-24 ENCOUNTER — Other Ambulatory Visit: Payer: Self-pay | Admitting: Family Medicine

## 2020-09-24 ENCOUNTER — Ambulatory Visit (INDEPENDENT_AMBULATORY_CARE_PROVIDER_SITE_OTHER): Payer: Self-pay | Admitting: Lactation Services

## 2020-09-24 ENCOUNTER — Encounter (HOSPITAL_COMMUNITY)
Admission: RE | Admit: 2020-09-24 | Discharge: 2020-09-24 | Disposition: A | Discharge status: Home / Self Care | Payer: Self-pay | Source: Ambulatory Visit | Attending: Family Medicine | Admitting: Family Medicine

## 2020-09-24 ENCOUNTER — Other Ambulatory Visit (HOSPITAL_COMMUNITY)
Admission: RE | Admit: 2020-09-24 | Discharge: 2020-09-24 | Disposition: A | Discharge status: Home / Self Care | Payer: Self-pay | Source: Ambulatory Visit | Attending: Family Medicine | Admitting: Family Medicine

## 2020-09-24 DIAGNOSIS — O24414 Gestational diabetes mellitus in pregnancy, insulin controlled: Secondary | ICD-10-CM

## 2020-09-24 DIAGNOSIS — O3663X0 Maternal care for excessive fetal growth, third trimester, not applicable or unspecified: Secondary | ICD-10-CM

## 2020-09-24 DIAGNOSIS — Z20822 Contact with and (suspected) exposure to covid-19: Secondary | ICD-10-CM | POA: Insufficient documentation

## 2020-09-24 DIAGNOSIS — Z98891 History of uterine scar from previous surgery: Secondary | ICD-10-CM

## 2020-09-24 DIAGNOSIS — Z01812 Encounter for preprocedural laboratory examination: Secondary | ICD-10-CM | POA: Insufficient documentation

## 2020-09-24 DIAGNOSIS — O0990 Supervision of high risk pregnancy, unspecified, unspecified trimester: Secondary | ICD-10-CM

## 2020-09-24 LAB — CBC
HCT: 37 % (ref 36.0–46.0)
Hemoglobin: 12.5 g/dL (ref 12.0–15.0)
MCH: 30.8 pg (ref 26.0–34.0)
MCHC: 33.8 g/dL (ref 30.0–36.0)
MCV: 91.1 fL (ref 80.0–100.0)
Platelets: 263 10*3/uL (ref 150–400)
RBC: 4.06 MIL/uL (ref 3.87–5.11)
RDW: 13.8 % (ref 11.5–15.5)
WBC: 6.2 10*3/uL (ref 4.0–10.5)
nRBC: 0 % (ref 0.0–0.2)

## 2020-09-24 LAB — SARS CORONAVIRUS 2 (TAT 6-24 HRS): SARS Coronavirus 2: NEGATIVE

## 2020-09-24 LAB — TYPE AND SCREEN
ABO/RH(D): O POS
Antibody Screen: NEGATIVE

## 2020-09-24 NOTE — Progress Notes (Signed)
Patient presents for BP check. Spoke with patient with assistance of [employer] Spanish [place], [place] #[id number].   She reports 2 days ago she had a lot of headaches, they are now gone. She sometimes has some blurred vision, last occurred last night and yesterday afternoon. She noted blurred vision when watching TV. She feels she is eating and drinking well. She reports swelling to her feet that is increasing. No pitting noted.   Patient reports infant is moving well. Patient has c/s scheduled for Wednesday 4/13. Patient has no questions or concerns at this time. She goes to the hospital today for blood work.   Reviewed if headache returns and is accompanied by blurred vision or dizziness, to go to [facility] for evaluation.   [facility] prior to leaving. Patient with no questions or concerns at this time.

## 2020-09-24 NOTE — Progress Notes (Signed)
Agree with A & P. 

## 2020-09-25 ENCOUNTER — Inpatient Hospital Stay (HOSPITAL_COMMUNITY): Payer: Medicaid Other

## 2020-09-25 LAB — RPR: RPR Ser Ql: NONREACTIVE

## 2020-09-26 ENCOUNTER — Inpatient Hospital Stay (HOSPITAL_COMMUNITY): Payer: Medicaid Other | Admitting: Anesthesiology

## 2020-09-26 ENCOUNTER — Other Ambulatory Visit: Payer: Self-pay

## 2020-09-26 ENCOUNTER — Encounter (HOSPITAL_COMMUNITY): Payer: Self-pay | Admitting: Family Medicine

## 2020-09-26 ENCOUNTER — Encounter (HOSPITAL_COMMUNITY)
Admission: AD | Disposition: A | Discharge status: Home / Self Care | Payer: Self-pay | Source: Home / Self Care | Attending: Family Medicine

## 2020-09-26 ENCOUNTER — Inpatient Hospital Stay (HOSPITAL_COMMUNITY)
Admission: AD | Admit: 2020-09-26 | Discharge: 2020-09-28 | DRG: 785 | Disposition: A | Discharge status: Home / Self Care | Payer: Medicaid Other | Attending: Family Medicine | Admitting: Family Medicine

## 2020-09-26 DIAGNOSIS — Z8616 Personal history of COVID-19: Secondary | ICD-10-CM | POA: Diagnosis not present

## 2020-09-26 DIAGNOSIS — O24414 Gestational diabetes mellitus in pregnancy, insulin controlled: Secondary | ICD-10-CM

## 2020-09-26 DIAGNOSIS — Z302 Encounter for sterilization: Secondary | ICD-10-CM

## 2020-09-26 DIAGNOSIS — O24424 Gestational diabetes mellitus in childbirth, insulin controlled: Secondary | ICD-10-CM | POA: Diagnosis present

## 2020-09-26 DIAGNOSIS — O99214 Obesity complicating childbirth: Secondary | ICD-10-CM | POA: Diagnosis present

## 2020-09-26 DIAGNOSIS — Z789 Other specified health status: Secondary | ICD-10-CM | POA: Diagnosis present

## 2020-09-26 DIAGNOSIS — O133 Gestational [pregnancy-induced] hypertension without significant proteinuria, third trimester: Secondary | ICD-10-CM | POA: Diagnosis present

## 2020-09-26 DIAGNOSIS — Z9851 Tubal ligation status: Secondary | ICD-10-CM

## 2020-09-26 DIAGNOSIS — O9903 Anemia complicating the puerperium: Secondary | ICD-10-CM | POA: Diagnosis not present

## 2020-09-26 DIAGNOSIS — Z8632 Personal history of gestational diabetes: Secondary | ICD-10-CM | POA: Diagnosis present

## 2020-09-26 DIAGNOSIS — O3663X0 Maternal care for excessive fetal growth, third trimester, not applicable or unspecified: Secondary | ICD-10-CM | POA: Diagnosis present

## 2020-09-26 DIAGNOSIS — O3660X0 Maternal care for excessive fetal growth, unspecified trimester, not applicable or unspecified: Secondary | ICD-10-CM | POA: Diagnosis present

## 2020-09-26 DIAGNOSIS — O99891 Other specified diseases and conditions complicating pregnancy: Secondary | ICD-10-CM | POA: Diagnosis present

## 2020-09-26 DIAGNOSIS — R8271 Bacteriuria: Secondary | ICD-10-CM | POA: Diagnosis present

## 2020-09-26 DIAGNOSIS — O135 Gestational [pregnancy-induced] hypertension without significant proteinuria, complicating the puerperium: Secondary | ICD-10-CM | POA: Diagnosis not present

## 2020-09-26 DIAGNOSIS — Z3A38 38 weeks gestation of pregnancy: Secondary | ICD-10-CM

## 2020-09-26 DIAGNOSIS — D649 Anemia, unspecified: Secondary | ICD-10-CM | POA: Diagnosis not present

## 2020-09-26 DIAGNOSIS — O09529 Supervision of elderly multigravida, unspecified trimester: Secondary | ICD-10-CM

## 2020-09-26 DIAGNOSIS — O9902 Anemia complicating childbirth: Secondary | ICD-10-CM | POA: Diagnosis present

## 2020-09-26 DIAGNOSIS — O0943 Supervision of pregnancy with grand multiparity, third trimester: Secondary | ICD-10-CM | POA: Diagnosis not present

## 2020-09-26 DIAGNOSIS — O134 Gestational [pregnancy-induced] hypertension without significant proteinuria, complicating childbirth: Secondary | ICD-10-CM | POA: Diagnosis present

## 2020-09-26 DIAGNOSIS — Z98891 History of uterine scar from previous surgery: Secondary | ICD-10-CM

## 2020-09-26 DIAGNOSIS — O24434 Gestational diabetes mellitus in the puerperium, insulin controlled: Secondary | ICD-10-CM | POA: Diagnosis not present

## 2020-09-26 LAB — COMPREHENSIVE METABOLIC PANEL
ALT: 18 U/L (ref 0–44)
AST: 27 U/L (ref 15–41)
Albumin: 2.6 g/dL — ABNORMAL LOW (ref 3.5–5.0)
Alkaline Phosphatase: 131 U/L — ABNORMAL HIGH (ref 38–126)
Anion gap: 9 (ref 5–15)
BUN: 10 mg/dL (ref 6–20)
CO2: 20 mmol/L — ABNORMAL LOW (ref 22–32)
Calcium: 9.5 mg/dL (ref 8.9–10.3)
Chloride: 108 mmol/L (ref 98–111)
Creatinine, Ser: 0.79 mg/dL (ref 0.44–1.00)
GFR, Estimated: >60 mL/min (ref >60)
Glucose, Bld: 77 mg/dL (ref 70–99)
Potassium: 3.9 mmol/L (ref 3.5–5.1)
Sodium: 137 mmol/L (ref 135–145)
Total Bilirubin: 0.6 mg/dL (ref 0.3–1.2)
Total Protein: 6.3 g/dL — ABNORMAL LOW (ref 6.5–8.1)

## 2020-09-26 LAB — PROTEIN / CREATININE RATIO, URINE
Creatinine, Urine: 95.63 mg/dL
Protein Creatinine Ratio: 0.19 mg/mg{Cre} — ABNORMAL HIGH (ref 0.00–0.15)
Total Protein, Urine: 18 mg/dL

## 2020-09-26 LAB — GLUCOSE, CAPILLARY
Glucose-Capillary: 78 mg/dL (ref 70–99)
Glucose-Capillary: 65 mg/dL — ABNORMAL LOW (ref 70–99)
Glucose-Capillary: 69 mg/dL — ABNORMAL LOW (ref 70–99)
Glucose-Capillary: 73 mg/dL (ref 70–99)

## 2020-09-26 SURGERY — Surgical Case
Anesthesia: Spinal | Site: Abdomen | Wound class: Clean Contaminated

## 2020-09-26 MED ORDER — OXYTOCIN-SODIUM CHLORIDE 30-0.9 UT/500ML-% IV SOLN
INTRAVENOUS | Status: AC
  Filled 2020-09-26: qty 500

## 2020-09-26 MED ORDER — FENTANYL CITRATE (PF) 100 MCG/2ML IJ SOLN
INTRAMUSCULAR | Status: AC
  Filled 2020-09-26: qty 2

## 2020-09-26 MED ORDER — LACTATED RINGERS IV SOLN: INTRAVENOUS | Status: DC

## 2020-09-26 MED ORDER — STERILE WATER FOR IRRIGATION IR SOLN
Status: DC | PRN
  Administered 2020-09-26: 1000 mL

## 2020-09-26 MED ORDER — IBUPROFEN 800 MG PO TABS
800.0000 mg | ORAL_TABLET | Freq: Four times a day (QID) | ORAL | Status: DC
  Administered 2020-09-27 – 2020-09-28 (×5): 800 mg via ORAL
  Filled 2020-09-26 (×5): qty 1

## 2020-09-26 MED ORDER — ALBUMIN HUMAN 5 % IV SOLN: INTRAVENOUS | Status: DC | PRN

## 2020-09-26 MED ORDER — OXYTOCIN-SODIUM CHLORIDE 30-0.9 UT/500ML-% IV SOLN
2.5000 [IU]/h | INTRAVENOUS | Status: AC
  Administered 2020-09-26: 2.5 [IU]/h via INTRAVENOUS
  Filled 2020-09-26: qty 500

## 2020-09-26 MED ORDER — SOD CITRATE-CITRIC ACID 500-334 MG/5ML PO SOLN
30.0000 mL | ORAL | Status: AC
  Administered 2020-09-26: 30 mL via ORAL

## 2020-09-26 MED ORDER — FENTANYL CITRATE (PF) 100 MCG/2ML IJ SOLN
INTRAMUSCULAR | Status: DC | PRN
  Administered 2020-09-26: 15 ug via INTRATHECAL

## 2020-09-26 MED ORDER — NALOXONE HCL 4 MG/10ML IJ SOLN
1.0000 ug/kg/h | INTRAVENOUS | Status: DC | PRN
  Filled 2020-09-26: qty 5

## 2020-09-26 MED ORDER — KETOROLAC TROMETHAMINE 30 MG/ML IJ SOLN
INTRAMUSCULAR | Status: AC
  Filled 2020-09-26: qty 1

## 2020-09-26 MED ORDER — DIBUCAINE (PERIANAL) 1 % EX OINT: 1.0000 "application " | TOPICAL_OINTMENT | CUTANEOUS | Status: DC | PRN

## 2020-09-26 MED ORDER — KETOROLAC TROMETHAMINE 30 MG/ML IJ SOLN: 30.0000 mg | Freq: Four times a day (QID) | INTRAMUSCULAR | Status: AC | PRN

## 2020-09-26 MED ORDER — KETOROLAC TROMETHAMINE 30 MG/ML IJ SOLN
30.0000 mg | Freq: Four times a day (QID) | INTRAMUSCULAR | Status: AC
  Administered 2020-09-26: 30 mg via INTRAVENOUS
  Filled 2020-09-26 (×2): qty 1

## 2020-09-26 MED ORDER — COCONUT OIL OIL: 1.0000 "application " | TOPICAL_OIL | Status: DC | PRN

## 2020-09-26 MED ORDER — TETANUS-DIPHTH-ACELL PERTUSSIS 5-2.5-18.5 LF-MCG/0.5 IM SUSY: 0.5000 mL | PREFILLED_SYRINGE | Freq: Once | INTRAMUSCULAR | Status: DC

## 2020-09-26 MED ORDER — ACETAMINOPHEN 500 MG PO TABS
1000.0000 mg | ORAL_TABLET | Freq: Four times a day (QID) | ORAL | Status: DC
  Administered 2020-09-26 – 2020-09-28 (×8): 1000 mg via ORAL
  Filled 2020-09-26 (×8): qty 2

## 2020-09-26 MED ORDER — SIMETHICONE 80 MG PO CHEW
80.0000 mg | CHEWABLE_TABLET | Freq: Three times a day (TID) | ORAL | Status: DC
  Administered 2020-09-26 – 2020-09-28 (×5): 80 mg via ORAL
  Filled 2020-09-26 (×6): qty 1

## 2020-09-26 MED ORDER — SCOPOLAMINE 1 MG/3DAYS TD PT72
MEDICATED_PATCH | TRANSDERMAL | Status: AC
  Filled 2020-09-26: qty 1

## 2020-09-26 MED ORDER — BUPIVACAINE IN DEXTROSE 0.75-8.25 % IT SOLN
INTRATHECAL | Status: DC | PRN
  Administered 2020-09-26: 1.6 mL via INTRATHECAL

## 2020-09-26 MED ORDER — SODIUM CHLORIDE 0.9 % IR SOLN
Status: DC | PRN
  Administered 2020-09-26: 1

## 2020-09-26 MED ORDER — ONDANSETRON HCL 4 MG/2ML IJ SOLN
INTRAMUSCULAR | Status: AC
  Filled 2020-09-26: qty 2

## 2020-09-26 MED ORDER — HYDROMORPHONE HCL 1 MG/ML IJ SOLN: 0.2500 mg | INTRAMUSCULAR | Status: DC | PRN

## 2020-09-26 MED ORDER — SCOPOLAMINE 1 MG/3DAYS TD PT72
1.0000 | MEDICATED_PATCH | Freq: Once | TRANSDERMAL | Status: DC
  Administered 2020-09-26: 1.5 mg via TRANSDERMAL

## 2020-09-26 MED ORDER — OXYCODONE HCL 5 MG PO TABS
5.0000 mg | ORAL_TABLET | ORAL | Status: DC | PRN
  Administered 2020-09-27: 5 mg via ORAL
  Filled 2020-09-26: qty 1

## 2020-09-26 MED ORDER — SENNOSIDES-DOCUSATE SODIUM 8.6-50 MG PO TABS
2.0000 | ORAL_TABLET | Freq: Every day | ORAL | Status: DC
  Administered 2020-09-27 – 2020-09-28 (×2): 2 via ORAL
  Filled 2020-09-26 (×3): qty 2

## 2020-09-26 MED ORDER — ENOXAPARIN SODIUM 60 MG/0.6ML ~~LOC~~ SOLN
60.0000 mg | SUBCUTANEOUS | Status: DC
  Administered 2020-09-27 – 2020-09-28 (×2): 60 mg via SUBCUTANEOUS
  Filled 2020-09-26 (×2): qty 0.6

## 2020-09-26 MED ORDER — PRENATAL MULTIVITAMIN CH
1.0000 | ORAL_TABLET | Freq: Every day | ORAL | Status: DC
  Administered 2020-09-27 – 2020-09-28 (×2): 1 via ORAL
  Filled 2020-09-26 (×2): qty 1

## 2020-09-26 MED ORDER — KETOROLAC TROMETHAMINE 30 MG/ML IJ SOLN
30.0000 mg | Freq: Once | INTRAMUSCULAR | Status: AC | PRN
  Administered 2020-09-26: 30 mg via INTRAVENOUS

## 2020-09-26 MED ORDER — MORPHINE SULFATE (PF) 0.5 MG/ML IJ SOLN
INTRAMUSCULAR | Status: DC | PRN
  Administered 2020-09-26: 150 ug via INTRATHECAL

## 2020-09-26 MED ORDER — TRANEXAMIC ACID-NACL 1000-0.7 MG/100ML-% IV SOLN
INTRAVENOUS | Status: DC | PRN
  Administered 2020-09-26: 1000 mg via INTRAVENOUS

## 2020-09-26 MED ORDER — PHENYLEPHRINE HCL-NACL 20-0.9 MG/250ML-% IV SOLN
INTRAVENOUS | Status: AC
  Filled 2020-09-26: qty 250

## 2020-09-26 MED ORDER — MENTHOL 3 MG MT LOZG: 1.0000 | LOZENGE | OROMUCOSAL | Status: DC | PRN

## 2020-09-26 MED ORDER — DIPHENHYDRAMINE HCL 25 MG PO CAPS: 25.0000 mg | ORAL_CAPSULE | Freq: Four times a day (QID) | ORAL | Status: DC | PRN

## 2020-09-26 MED ORDER — MEPERIDINE HCL 25 MG/ML IJ SOLN
6.2500 mg | INTRAMUSCULAR | Status: DC | PRN
Start: 2020-09-26 — End: 2020-09-26

## 2020-09-26 MED ORDER — ONDANSETRON HCL 4 MG/2ML IJ SOLN
INTRAMUSCULAR | Status: DC | PRN
  Administered 2020-09-26: 4 mg via INTRAVENOUS

## 2020-09-26 MED ORDER — ALBUMIN HUMAN 5 % IV SOLN
INTRAVENOUS | Status: AC
  Filled 2020-09-26: qty 250

## 2020-09-26 MED ORDER — PROMETHAZINE HCL 25 MG/ML IJ SOLN: 6.2500 mg | INTRAMUSCULAR | Status: DC | PRN

## 2020-09-26 MED ORDER — CEFAZOLIN SODIUM-DEXTROSE 2-4 GM/100ML-% IV SOLN
2.0000 g | INTRAVENOUS | Status: AC
  Administered 2020-09-26: 2 g via INTRAVENOUS

## 2020-09-26 MED ORDER — ONDANSETRON HCL 4 MG/2ML IJ SOLN: 4.0000 mg | Freq: Three times a day (TID) | INTRAMUSCULAR | Status: DC | PRN

## 2020-09-26 MED ORDER — NALBUPHINE HCL 10 MG/ML IJ SOLN: 5.0000 mg | Freq: Once | INTRAMUSCULAR | Status: DC | PRN

## 2020-09-26 MED ORDER — SODIUM CHLORIDE 0.9% FLUSH: 3.0000 mL | INTRAVENOUS | Status: DC | PRN

## 2020-09-26 MED ORDER — PHENYLEPHRINE HCL (PRESSORS) 10 MG/ML IV SOLN: INTRAVENOUS | Status: DC | PRN

## 2020-09-26 MED ORDER — DIPHENHYDRAMINE HCL 50 MG/ML IJ SOLN: 12.5000 mg | INTRAMUSCULAR | Status: DC | PRN

## 2020-09-26 MED ORDER — PHENYLEPHRINE HCL (PRESSORS) 10 MG/ML IV SOLN
INTRAVENOUS | Status: DC | PRN
  Administered 2020-09-26 (×2): 80 ug via INTRAVENOUS

## 2020-09-26 MED ORDER — TRANEXAMIC ACID-NACL 1000-0.7 MG/100ML-% IV SOLN
INTRAVENOUS | Status: AC
  Filled 2020-09-26: qty 100

## 2020-09-26 MED ORDER — NALOXONE HCL 0.4 MG/ML IJ SOLN: 0.4000 mg | INTRAMUSCULAR | Status: DC | PRN

## 2020-09-26 MED ORDER — NALBUPHINE HCL 10 MG/ML IJ SOLN
5.0000 mg | INTRAMUSCULAR | Status: DC | PRN
Start: 2020-09-26 — End: 2020-09-28

## 2020-09-26 MED ORDER — SOD CITRATE-CITRIC ACID 500-334 MG/5ML PO SOLN
ORAL | Status: AC
  Filled 2020-09-26: qty 30

## 2020-09-26 MED ORDER — DIPHENHYDRAMINE HCL 25 MG PO CAPS
25.0000 mg | ORAL_CAPSULE | ORAL | Status: DC | PRN
Start: 2020-09-26 — End: 2020-09-28

## 2020-09-26 MED ORDER — OXYTOCIN-SODIUM CHLORIDE 30-0.9 UT/500ML-% IV SOLN
INTRAVENOUS | Status: DC | PRN
  Administered 2020-09-26: 30 [IU] via INTRAVENOUS

## 2020-09-26 MED ORDER — PHENYLEPHRINE HCL-NACL 20-0.9 MG/250ML-% IV SOLN
INTRAVENOUS | Status: DC | PRN
  Administered 2020-09-26: 60 ug/min via INTRAVENOUS

## 2020-09-26 MED ORDER — MORPHINE SULFATE (PF) 0.5 MG/ML IJ SOLN
INTRAMUSCULAR | Status: AC
  Filled 2020-09-26: qty 10

## 2020-09-26 MED ORDER — NALBUPHINE HCL 10 MG/ML IJ SOLN: 5.0000 mg | INTRAMUSCULAR | Status: DC | PRN

## 2020-09-26 MED ORDER — MEPERIDINE HCL 25 MG/ML IJ SOLN: 6.2500 mg | INTRAMUSCULAR | Status: DC | PRN

## 2020-09-26 MED ORDER — WITCH HAZEL-GLYCERIN EX PADS: 1.0000 "application " | MEDICATED_PAD | CUTANEOUS | Status: DC | PRN

## 2020-09-26 MED ORDER — SIMETHICONE 80 MG PO CHEW
80.0000 mg | CHEWABLE_TABLET | ORAL | Status: DC | PRN
Start: 2020-09-26 — End: 2020-09-28

## 2020-09-26 MED ORDER — ACETAMINOPHEN 500 MG PO TABS: 1000.0000 mg | ORAL_TABLET | Freq: Four times a day (QID) | ORAL | Status: DC

## 2020-09-26 SURGICAL SUPPLY — 30 items
BENZOIN TINCTURE PRP APPL 2/3 (GAUZE/BANDAGES/DRESSINGS) ×3 IMPLANT
CHLORAPREP W/TINT 26ML (MISCELLANEOUS) ×3 IMPLANT
CLIP FILSHIE TUBAL LIGA STRL (Clip) IMPLANT
CLOTH BEACON ORANGE TIMEOUT ST (SAFETY) ×3 IMPLANT
DRESSING PREVENA PLUS CUSTOM (GAUZE/BANDAGES/DRESSINGS) ×2 IMPLANT
DRSG OPSITE POSTOP 4X10 (GAUZE/BANDAGES/DRESSINGS) ×3 IMPLANT
DRSG PREVENA PLUS CUSTOM (GAUZE/BANDAGES/DRESSINGS) ×3
ELECT REM PT RETURN 9FT ADLT (ELECTROSURGICAL) ×3
ELECTRODE REM PT RTRN 9FT ADLT (ELECTROSURGICAL) ×2 IMPLANT
GLOVE BIOGEL PI IND STRL 7.0 (GLOVE) ×6 IMPLANT
GLOVE BIOGEL PI INDICATOR 7.0 (GLOVE) ×3
GLOVE ECLIPSE 6.5 STRL STRAW (GLOVE) ×3 IMPLANT
GOWN STRL REUS W/ TWL LRG LVL3 (GOWN DISPOSABLE) ×4 IMPLANT
GOWN STRL REUS W/TWL LRG LVL3 (GOWN DISPOSABLE) ×6
NS IRRIG 1000ML POUR BTL (IV SOLUTION) ×3 IMPLANT
PAD OB MATERNITY 4.3X12.25 (PERSONAL CARE ITEMS) ×3 IMPLANT
PAD PREP 24X48 CUFFED NSTRL (MISCELLANEOUS) ×3 IMPLANT
RETRACTOR WND ALEXIS 25 LRG (MISCELLANEOUS) IMPLANT
RTRCTR WOUND ALEXIS 25CM LRG (MISCELLANEOUS)
STRIP CLOSURE SKIN 1/2X4 (GAUZE/BANDAGES/DRESSINGS) ×3 IMPLANT
SUT PLAIN 2 0 XLH (SUTURE) ×3 IMPLANT
SUT VIC AB 0 CT1 27 (SUTURE) ×3
SUT VIC AB 0 CT1 27XBRD ANBCTR (SUTURE) ×2 IMPLANT
SUT VIC AB 0 CT1 36 (SUTURE) ×6 IMPLANT
SUT VIC AB 2-0 CT1 27 (SUTURE) ×3
SUT VIC AB 2-0 CT1 TAPERPNT 27 (SUTURE) ×2 IMPLANT
SUT VIC AB 4-0 KS 27 (SUTURE) ×3 IMPLANT
TOWEL OR 17X24 6PK STRL BLUE (TOWEL DISPOSABLE) ×9 IMPLANT
TRAY FOLEY CATH SILVER 16FR (SET/KITS/TRAYS/PACK) ×3 IMPLANT
WATER STERILE IRR 1000ML POUR (IV SOLUTION) ×3 IMPLANT

## 2020-09-26 NOTE — Discharge Instructions (Signed)
Postpartum Care After Cesarean Delivery This sheet gives you information about how to care for yourself from the time you deliver your baby to up to 6-12 weeks after delivery (postpartum period). Your health care provider may also give you more specific instructions. If you have problems or questions, contact your health care provider. Follow these instructions at home: Medicines  Take over-the-counter and prescription medicines only as told by your health care provider.  If you were prescribed an antibiotic medicine, take it as told by your health care provider. Do not stop taking the antibiotic even if you start to feel better.  Ask your health care provider if the medicine prescribed to you: ? Requires you to avoid driving or using heavy machinery. ? Can cause constipation. You may need to take actions to prevent or treat constipation, such as:  Drink enough fluid to keep your urine pale yellow.  Take over-the-counter or prescription medicines.  Eat foods that are high in fiber, such as beans, whole grains, and fresh fruits and vegetables.  Limit foods that are high in fat and processed sugars, such as fried or sweet foods. Activity  Gradually return to your normal activities as told by your health care provider.  Avoid activities that take a lot of effort and energy (are strenuous) until approved by your health care provider. Walking at a slow to moderate pace is usually safe. Ask your health care provider what activities are safe for you. ? Do not lift anything that is heavier than your baby or 10 lb (4.5 kg) as told by your health care provider. ? Do not vacuum, climb stairs, or drive a car for as long as told by your health care provider.  If possible, have someone help you at home until you are able to do your usual activities yourself.  Rest as much as possible. Try to rest or take naps while your baby is sleeping. Vaginal bleeding  It is normal to have vaginal bleeding  (lochia) after delivery. Wear a sanitary pad to absorb vaginal bleeding and discharge. ? During the first week after delivery, the amount and appearance of lochia is often similar to a menstrual period. ? Over the next few weeks, it will gradually decrease to a dry, yellow-brown discharge. ? For most women, lochia stops completely by 4-6 weeks after delivery. Vaginal bleeding can vary from woman to woman.  Change your sanitary pads frequently. Watch for any changes in your flow, such as: ? A sudden increase in volume. ? A change in color. ? Large blood clots.  If you pass a blood clot, save it and call your health care provider to discuss. Do not flush blood clots down the toilet before you get instructions from your health care provider.  Do not use tampons or douches until your health care provider says this is safe.  If you are not breastfeeding, your period should return 6-8 weeks after delivery. If you are breastfeeding, your period may return anytime between 8 weeks after delivery and the time that you stop breastfeeding. Perineal care  If your C-section (Cesarean section) was unplanned, and you were allowed to labor and push before delivery, you may have pain, swelling, and discomfort of the tissue between your vaginal opening and your anus (perineum). You may also have an incision in the tissue (episiotomy) or the tissue may have torn during delivery. Follow these instructions as told by your health care provider: ? Keep your perineum clean and dry as told by your   health care provider. Use medicated pads and pain-relieving sprays and creams as directed. ? If you have an episiotomy or vaginal tear, check the area every day for signs of infection. Check for:  Redness, swelling, or pain.  Fluid or blood.  Warmth.  Pus or a bad smell. ? You may be given a squirt bottle to use instead of wiping to clean the perineum area after you go to the bathroom. As you start healing, you may use  the squirt bottle before wiping yourself. Make sure to wipe gently. ? To relieve pain caused by an episiotomy, vaginal tear, or hemorrhoids, try taking a warm sitz bath 2-3 times a day. A sitz bath is a warm water bath that is taken while you are sitting down. The water should only come up to your hips and should cover your buttocks.   Breast care  Within the first few days after delivery, your breasts may feel heavy, full, and uncomfortable (breast engorgement). You may also have milk leaking from your breasts. Your health care provider can suggest ways to help relieve breast discomfort. Breast engorgement should go away within a few days.  If you are breastfeeding: ? Wear a bra that supports your breasts and fits you well. ? Keep your nipples clean and dry. Apply creams and ointments as told by your health care provider. ? You may need to use breast pads to absorb milk leakage. ? You may have uterine contractions every time you breastfeed for several weeks after delivery. Uterine contractions help your uterus return to its normal size. ? If you have any problems with breastfeeding, work with your health care provider or a lactation consultant.  If you are not breastfeeding: ? Avoid touching your breasts as this can make your breasts produce more milk. ? Wear a well-fitting bra and use cold packs to help with swelling. ? Do not squeeze out (express) milk. This causes you to make more milk. Intimacy and sexuality  Ask your health care provider when you can engage in sexual activity. This may depend on your: ? Risk of infection. ? Healing rate. ? Comfort and desire to engage in sexual activity.  You are able to get pregnant after delivery, even if you have not had your period. If desired, talk with your health care provider about methods of family planning or birth control (contraception). Lifestyle  Do not use any products that contain nicotine or tobacco, such as cigarettes, e-cigarettes,  and chewing tobacco. If you need help quitting, ask your health care provider.  Do not drink alcohol, especially if you are breastfeeding. Eating and drinking  Drink enough fluid to keep your urine pale yellow.  Eat high-fiber foods every day. These may help prevent or relieve constipation. High-fiber foods include: ? Whole grain cereals and breads. ? Brown rice. ? Beans. ? Fresh fruits and vegetables.  Take your prenatal vitamins until your postpartum checkup or until your health care provider tells you it is okay to stop.   General instructions  Keep all follow-up visits for you and your baby as told by your health care provider. Most women visit their health care provider for a postpartum checkup within the first 3-6 weeks after delivery. Contact a health care provider if you:  Feel unable to cope with the changes that a new baby brings to your life, and these feelings do not go away.  Feel unusually sad or worried.  Have breasts that are painful, hard, or turn red.  Have a   fever.  Have trouble holding urine or keeping urine from leaking.  Have little or no interest in activities you used to enjoy.  Have not breastfed at all and you have not had a menstrual period for 12 weeks after delivery.  Have stopped breastfeeding and you have not had a menstrual period for 12 weeks after you stopped breastfeeding.  Have questions about caring for yourself or your baby.  Pass a blood clot from your vagina. Get help right away if you:  Have chest pain.  Have difficulty breathing.  Have sudden, severe leg pain.  Have severe pain or cramping in your abdomen.  Bleed from your vagina so much that you fill more than one sanitary pad in one hour. Bleeding should not be heavier than your heaviest period.  Develop a severe headache.  Faint.  Have blurred vision or spots in your vision.  Have a bad-smelling vaginal discharge.  Have thoughts about hurting yourself or your  baby. If you ever feel like you may hurt yourself or others, or have thoughts about taking your own life, get help right away. You can go to your nearest emergency department or call:  Your local emergency services (911 in the [place]  A suicide crisis helpline, such as the National Suicide Prevention Lifeline at 856 667 2454. This is open 24 hours a day. Summary  The period of time from when you deliver your baby to up to 6-12 weeks after delivery is called the postpartum period.  Gradually return to your normal activities as told by your health care provider.  Keep all follow-up visits for you and your baby as told by your health care provider. This information is not intended to replace advice given to you by your health care provider. Make sure you discuss any questions you have with your health care provider. Document Revised: 01/20/2018 Document Reviewed: 01/20/2018 Elsevier Patient Education  2021 Elsevier Inc.   Recanalizacin de la ligadura de trompas, cuidados posteriores Tubal Ligation Reversal, Care After La siguiente informacin ofrece orientacin sobre cmo cuidarse despus del procedimiento. El mdico tambin podr darle instrucciones ms especficas. Comunquese con el mdico si tiene problemas o preguntas. Qu puedo esperar despus del procedimiento? Despus del procedimiento, es normal tener los siguientes sntomas:  Molestias abdominales leves. Este puede incluir a los siguientes: ? Calambres leves. ? Dolores ocasionados por gases o sensacin de hinchazn. ? Dolor o Film/video editor de la incisin.  Malestar en la zona del hombro. Esto se produce cuando el aire queda atrapado entre el hgado y [place]. Este Teacher, early years/pre por s solo.  Cansancio.  Dolor de garganta si tena un tubo para respirar.  Nuseas o vmitos.  Secrecin vaginal que puede requerir el uso de un apsito sanitario. Siga estas instrucciones en su  casa: Medicamentos  Use los medicamentos de venta libre y los recetados solamente como se lo haya indicado el mdico.  No tome aspirina, ya que puede causar hemorragias.  No conduzca ni use maquinaria pesada mientras toma analgsicos recetados.  Pregntele al mdico si el medicamento recetado: ? Hace necesario que evite conducir o Child psychotherapist. ? Puede causarle estreimiento. Es posible que tenga que tomar estas medidas para prevenir o tratar el estreimiento:  Product manager suficiente lquido como para Pharmacologist la orina de color amarillo plido.  Usar medicamentos recetados o de Sales promotion account executive.  Consumir alimentos ricos en fibra, como frijoles, cereales integrales, y frutas y verduras frescas.  Limitar el consumo de alimentos ricos en grasa y  azcares procesados, como los alimentos fritos o dulces. Actividad  Haga reposo como se lo haya indicado el mdico.  Evite estar sentada durante largos perodos sin moverse. Levntese y camine un poco cada 1 a 2 horas. Esto es importante para mejorar el flujo sanguneo y la respiracin. Pida ayuda si se siente dbil o inestable.  No tenga relaciones sexuales, no use tampones ni se haga duchas vaginales durante todo el ciclo menstrual o segn le indique el mdico.  No levante ningn objeto que pese ms de10 libras (4.5kg) o que supere el lmite de peso que le hayan indicado, hasta que el mdico le diga que puede [place].  Pida que alguien la ayude con los quehaceres diarios [employer] primeros 7 a 10das, o segn lo recomendado por el mdico.  Retome sus actividades normales segn lo indicado por el mdico. Pregntele al mdico qu actividades son seguras para usted.   Cuidado de la incisin  Siga las instrucciones del mdico acerca del cuidado de las incisiones. Asegrese de hacer lo siguiente: ? Lvese las manos con agua y jabn durante al menos 20segundos antes y despus de cambiarse la venda (vendaje). Use desinfectante para manos si no  dispone de [place] y [place]. ? Cambie el vendaje como se lo haya indicado el mdico. ? No retire los puntos (suturas), la goma para cerrar la piel o las tiras [place]. Es posible que estos cierres cutneos deban quedar puestos en la piel durante 2semanas o ms tiempo. Si los bordes de las tiras [address] empiezan a despegarse y Scientific laboratory technician, puede recortar los que estn sueltos. No retire las tiras [employer] por completo a menos que el mdico se lo indique.  No tome baos de inmersin, no nade ni use el jacuzzi hasta que el mdico lo autorice. Pregntele al mdico si puede ducharse. Delle Reining solo le permitan darse baos de [place].  Controle la zona de la incisin todos los [address]  [address] para detectar signos de infeccin. Est atenta a los siguientes signos: ? Enrojecimiento, hinchazn o ms dolor. ? Lquido o sangre. ? Calor. ? Pus o mal olor.   Indicaciones generales  No consuma ningn producto que contenga nicotina o tabaco. Estos productos incluyen cigarrillos, tabaco para Theatre manager y aparatos de vapeo, como los Administrator, Civil Service. Si necesita ayuda para dejar de consumir estos productos, consulte al [employer].  Probablemente deba usar un apsito sanitario para las secreciones vaginales despus del procedimiento.  Cumpla con todas las visitas de seguimiento. Esto es importante. ? Debern hacerle una radiografa con un medio de contraste de 3a despus de la ciruga para asegurarse de que las trompas estn abiertas. Comunquese con un mdico si:  Tiene signos de infeccin, como los siguientes: ? Enrojecimiento, hinchazn o ms dolor alrededor [employer] lugares de la incisin. ? Sangre o lquido provenientes de la incisin. ? Una incisin est caliente al tacto. ? Pus o mal olor provenientes de la incisin.  Una incisin se abre.  Tiene fiebre.  Tiene una erupcin cutnea.  Se siente mareada o sufre un desmayo.  No puede comer ni beber sin vomitar.  Tiene estreimiento.  Tiene  sensacin de ardor o dolor al [employer]. Solicite ayuda de inmediato si:  Le falta el aire o tiene dificultad para respirar.  Tiene dolor torcico, dolor en las piernas o hinchazn de las piernas.  Se desmaya.  Tiene dolor en el abdomen que [place], y el dolor no se alivia con analgsicos.  Tiene un sangrado vaginal abundante, que empapa un apsito sanitario en  menos de 1 hora, cuando no es el momento de su perodo menstrual. Estos sntomas pueden representar un problema grave que constituye Radio broadcast assistant. No espere a ver si los sntomas desaparecen. Solicite atencin mdica de inmediato. Comunquese con el servicio de emergencias de su localidad (911 en los [place]). No conduzca por sus propios medios [employer]. Resumen  Es normal tener dolor abdominal leve, dolor por gases, distensin abdominal y cansancio despus del procedimiento.  Use los medicamentos de venta libre y los recetados solamente como se lo haya indicado el mdico.  Controle la zona de la incisin todos los [address]  [address] para detectar signos de infeccin. Comunquese con el mdico si tiene algn signo de infeccin.  Concurra a todas las visitas de [address] se lo haya indicado el mdico. Esto es importante. Debern hacerle una radiografa de control con un medio de contraste de 3a despus de la ciruga para asegurarse de que las trompas estn abiertas. Esta informacin no tiene Theme park manager el consejo del mdico. Asegrese de hacerle al mdico cualquier pregunta que tenga. Document Revised: 04/10/2020 Document Reviewed: 04/10/2020 Elsevier Patient Education  2021 [employer].

## 2020-09-26 NOTE — Anesthesia Preprocedure Evaluation (Signed)
Anesthesia Evaluation  Patient identified by MRN, date of birth, ID band Patient awake    Reviewed: Allergy & Precautions, H&P , NPO status , Patient's Chart, lab work & pertinent test results  Airway Mallampati: II  TM Distance: >3 FB Neck ROM: full    Dental no notable dental hx.    Pulmonary neg pulmonary ROS,    Pulmonary exam normal        Cardiovascular Normal cardiovascular exam     Neuro/Psych negative neurological ROS  negative psych ROS   GI/Hepatic negative GI ROS, Neg liver ROS,   Endo/Other  diabetes, Gestational, Insulin DependentMorbid obesity  Renal/GU negative Renal ROS  negative genitourinary   Musculoskeletal negative musculoskeletal ROS (+)   Abdominal (+) + obese,   Peds  Hematology negative hematology ROS (+)   Anesthesia Other Findings   Reproductive/Obstetrics (+) Pregnancy                             Anesthesia Physical Anesthesia Plan  ASA: III  Anesthesia Plan: Spinal   Post-op Pain Management:    Induction:   PONV Risk Score and Plan: Ondansetron, Midazolam and Treatment may vary due to age or medical condition  Airway Management Planned: Natural Airway, Nasal Cannula and Simple Face Mask  Additional Equipment: None  Intra-op Plan:   Post-operative Plan:   Informed Consent: I have reviewed the patients History and Physical, chart, labs and discussed the procedure including the risks, benefits and alternatives for the proposed anesthesia with the patient or authorized representative who has indicated his/her understanding and acceptance.       Plan Discussed with: CRNA  Anesthesia Plan Comments:         Anesthesia Quick Evaluation

## 2020-09-26 NOTE — H&P (Signed)
OBSTETRIC ADMISSION HISTORY AND PHYSICAL  Sara Fowler is a 35 y.o. female [phone] with IUP at [redacted]w[redacted]d by LMP presenting for scheduled primary Cesarean secondary to fetal macrosomia. She reports +FMs, No LOF, no VB, no blurry vision, headaches or peripheral edema, and RUQ pain.  She plans on breast and formula feeding. She requests postpartum BTL for birth control. She received her prenatal care at [facility]   Dating: By LMP --->  Estimated Date of Delivery: 10/09/20  Sono:  @[redacted]w[redacted]d , CWD, normal anatomy, cephalic presentation, 4055g, EFW, >99% AC  Prenatal History/Complications:  - A2GDM (NPH insulin 34u in AM, 14u in PM) - Transient HTN - Macrosomia (as noted above) - Language barrier - Desire for permanent sterilization - AMA  Past Medical History: Past Medical History:  Diagnosis Date  . Abscess of breast    Right  . COVID-19   . Gestational diabetes   . Malpositioned IUD 12/2016  . Stress headaches     Past Surgical History: Past Surgical History:  Procedure Laterality Date  . HYSTEROSCOPY WITH D & C N/A 01/14/2017   Procedure: DILATATION AND CURETTAGE /HYSTEROSCOPY W/ IUD REMOVAL;  Surgeon: 03/16/2017, MD;  Location: [facility];  Service: Gynecology;  Laterality: N/A;  . NO PAST SURGERIES      Obstetrical History: OB History    Gravida  4   Para  3   Term  3   Preterm  0   AB  0   Living  3     SAB  0   IAB  0   Ectopic  0   Multiple  0   Live Births  3        Obstetric Comments  Largest prior was 8.5 lbs        Social History Social History   Socioeconomic History  . Marital status: Married    Spouse name: Not on file  . Number of children: Not on file  . Years of education: Not on file  . Highest education level: 8th grade  Occupational History  . Occupation: Employed  Tobacco Use  . Smoking status: Never Smoker  . Smokeless tobacco: Never Used  Vaping Use  . Vaping Use: Never used  Substance and Sexual  Activity  . Alcohol use: Not Currently    Comment: occasionally  . Drug use: Never  . Sexual activity: Not Currently    Birth control/protection: I.U.D., None  Other Topics Concern  . Not on file  Social History Narrative  . Not on file   Social Determinants of Health   Financial Resource Strain: Not on file  Food Insecurity: Food Insecurity Present  . Worried About Hermina Staggers in the Last Year: Sometimes true  . Ran Out of Food in the Last Year: Never true  Transportation Needs: No Transportation Needs  . Lack of Transportation (Medical): No  . Lack of Transportation (Non-Medical): No  Physical Activity: Not on file  Stress: Not on file  Social Connections: Not on file    Family History: Family History  Problem Relation Age of Onset  . Asthma Daughter     Allergies: No Known Allergies  Medications Prior to Admission  Medication Sig Dispense Refill Last Dose  . acetaminophen (TYLENOL) 325 MG tablet Take 650 mg by mouth every 6 (six) hours as needed for moderate pain or headache.     . insulin NPH Human (NOVOLIN N) 100 UNIT/ML injection 34 UNITS WITH BREAKFAST AND 14 UNITS  BEFORE BED (Patient taking differently: Inject 20-37 Units into the skin See admin instructions. Inject 37 units with breakfast and 20 units before bed) 10 mL 3   . insulin regular (NOVOLIN R) 100 units/mL injection 18 UNITS WITH BREAKFAST AND 14 UNITS WITH SUPPER (Patient taking differently: Inject 14-20 Units into the skin See admin instructions. Inject 20 units with breakfast and 14 units with supper) 10 mL 11   . Insulin Syringe-Needle U-100 30G X 5/16" 1 ML MISC 1 Units by Does not apply route 3 (three) times daily. 90 each 3 09/25/2020 at Unknown time  . Prenatal Vit-Fe Fumarate-FA (PRENATAL MULTIVITAMIN) TABS tablet Take 1 tablet by mouth daily.   09/25/2020 at Unknown time  . Isopropyl Alcohol (ALCOHOL WIPES) 70 % MISC Apply 1 Units topically 4 (four) times daily. 100 each 3   . RELION INSULIN  SYRINGE 1ML/31G 31G X 5/16" 1 ML MISC Inject into the skin 3 (three) times daily.        Review of Systems   All systems reviewed and negative except as stated in HPI  Last menstrual period 01/03/2020. General appearance: alert, cooperative and appears stated age Lungs: normal WOB Heart: regular rate  Abdomen: soft, non-tender Extremities: no sign of DVT Fetal monitoring: FHT wnl on Dopplers    Prenatal labs: ABO, Rh: --/--/O POS (04/11 1125) Antibody: NEG (04/11 1125) Rubella: Immune (12/20 0000) RPR: NON REACTIVE (04/11 1115)  HBsAg: Negative (12/20 0000)  HIV: Non-reactive (12/20 0000)  GBS: Negative/-- (03/31 0331)  1 hr Glucola 150, 3hr Glucola 94/216/181/142 Genetic screening none Anatomy US wnl  Prenatal Transfer Tool  Maternal Diabetes: Yes:  Diabetes Type:  Insulin/Medication controlled Genetic Screening:  Maternal Ultrasounds/Referrals: Normal except for EFW >99%, AC >99% Fetal Ultrasounds or other Referrals:  None Maternal Substance Abuse:  No Significant Maternal Medications:  Meds include: Other:  insulin Significant Maternal Lab Results: Group B Strep negative  No results found for this or any previous visit (from the past 24 hour(s)).  Patient Active Problem List   Diagnosis Date Noted  . S/P primary low transverse C-section 09/26/2020  . Transient hypertension of pregnancy in third trimester 09/21/2020  . LGA (large for gestational age) fetus affecting management of mother 08/22/2020  . AMA (advanced maternal age) multigravida 35+ 08/14/2020  . Language barrier 08/14/2020  . Asymptomatic bacteriuria during pregnancy 08/14/2020  . Supervision of high risk pregnancy, antepartum 07/31/2020  . Gestational diabetes requiring insulin 07/12/2020    Assessment/Plan:  Sara Fowler is a 35 y.o. [id number] at [redacted]w[redacted]d here for scheduled primary Cesarean for fetal macrosomia.  #Primary Cesarean + Postpartum BTL:  The risks of cesarean section were  discussed with the patient including but were not limited to: bleeding which may require transfusion or reoperation; infection which may require antibiotics; injury to bowel, bladder, ureters or other surrounding organs; injury to the fetus; need for additional procedures including hysterectomy in the event of a life-threatening hemorrhage; placental abnormalities wth subsequent pregnancies, incisional problems, thromboembolic phenomenon and other postoperative/anesthesia complications.  Patient also desires permanent sterilization.  Other reversible forms of contraception were discussed with patient; she declines all other modalities. Risks of procedure discussed with patient including but not limited to: risk of regret, permanence of method, bleeding, infection, injury to surrounding organs and need for additional procedures.  Failure risk of about 1% with increased risk of ectopic gestation if pregnancy occurs was also discussed with patient.  Also discussed possibility of post-tubal pain syndrome. The patient concurred with the  proposed plan, giving informed written consent for the procedures.  Patient has been NPO since yesterday; she will remain NPO for procedure. Anesthesia and OR aware.  Preoperative prophylactic antibiotics and SCDs ordered on call to the OR.  To OR when ready.  #Pain: Per anesthesia #FWB: FHT wnl on Dopplers #ID: GBS negative #MOF: breast & formula #MOC: plan for BTL as noted above #Circ: no #A2GDM: Good adherence to NPH BID in pregnancy. BG 78 on admission. Plan for FBG on POD#1. #Transient HTN: blood pressure 140/73 on admission. Will obtain PreE labs and monitor BP closely. Pt without signs/symptoms of preeclampsia.  Sheila Oats, MD OB Fellow, Faculty Practice 09/26/2020 12:15 PM

## 2020-09-26 NOTE — Anesthesia Procedure Notes (Signed)
Spinal  Patient location during procedure: OR Start time: 09/26/2020 1:33 PM End time: 09/26/2020 1:37 PM Reason for block: surgical anesthesia Staffing Performed: anesthesiologist  Anesthesiologist: Leilani Able, MD Preanesthetic Checklist Completed: patient identified, IV checked, site marked, risks and benefits discussed, surgical consent, monitors and equipment checked, pre-op evaluation and timeout performed Spinal Block Patient position: sitting Prep: DuraPrep and site prepped and draped Patient monitoring: continuous pulse ox and blood pressure Approach: midline Location: L3-4 Injection technique: single-shot Needle Needle type: Pencan  Needle gauge: 24 G Needle length: 10 cm Needle insertion depth: 7 cm Assessment Sensory level: T4 Events: CSF return

## 2020-09-26 NOTE — Lactation Note (Signed)
This note was copied from a baby's chart. Lactation Consultation Note  Patient Name: Sara Fowler [id number] Date: 09/26/2020 Reason for consult: Initial assessment;Other (Comment);Maternal endocrine disorder;Early term 37-38.6wks (AMA, LGA) Age:37 hours  Visited with mom of 4 hours old ETI LGA female, she's a P4 and experienced BF. Baby is having hypoglycemia with serum glucose at 39 mg/dL and he's been given Similac 20 calorie formula already but per RN Shanda Bumps, he took it "too fast", baby took 30 ml on the last feeding, unable to work on latching at this point, baby wasn't ready to feed at the breast, he was asleep on his bassinet.  Reviewed hand expression with mom, and she has lots of colostrum. Mom's feeding choice on admission is to do both, breast and formula, baby won't be using a slow flow nipple on the next feeding, but an extra slow flow (purple) nipple, RN notified. LC also set mom up with a hand pump, she doesn't have one at home, but she might also use it at the hospital to start supplementing baby with her EBM, she has great colostrum.  Reviewed normal newborn behavior, cluster feeding, feeding cues, newborn hypoglycemia and supplementation guidelines when feeding baby at the breast. Asked mom to call for assistance when needed and advised her to start putting baby to breast on feeding cues.  Feeding plan:  1. Encouraged mom to feed baby STS 8-12 times/24 hours or sooner if feeding cues are present 2. Hand expression and spoon feeding were also encouraged, as well as pumping 3. Parents will continue supplementing baby with EBM/formula to treat/prevent hypoglycemia  BF brochure (SP), BF resources (SP) and feeding diary (SP) were reviewed. FOB present at the time of [facility] consultation. Parents reported all questions and concerns were answered, they're both aware of LC OP services and will call PRN.     Maternal Data Has patient been taught Hand Expression?: Yes Does the  patient have breastfeeding experience prior to this delivery?: Yes How long did the patient breastfeed?: BF all her babies for a year, and the last one for 2 years  Feeding Mother's Current Feeding Choice: Breast Milk and Formula Nipple Type: Slow - flow   Lactation Tools Discussed/Used Tools: Pump Breast pump type: Manual Pump Education: Setup, frequency, and cleaning;Milk Storage Reason for Pumping: mother's request (doesn't have a pump at home) Pumping frequency: as needed, mom chooses to do both, breast and formula  Interventions Interventions: Breast feeding basics reviewed;Education;Hand pump;Breast compression;Hand express;Breast massage  Discharge Pump: Manual WIC Program: Yes  Consult Status Consult Status: Follow-up Date: 09/27/20 Follow-up type: In-patient    Sara Fowler 09/26/2020, 6:23 PM

## 2020-09-26 NOTE — Transfer of Care (Signed)
Immediate Anesthesia Transfer of Care Note  Patient: Sara Fowler  Procedure(s) Performed: CESAREAN SECTION WITH BILATERAL TUBAL LIGATION (N/A Abdomen)  Patient Location: PACU  Anesthesia Type:Spinal  Level of Consciousness: awake  Airway & Oxygen Therapy: Patient Spontanous Breathing  Post-op Assessment: Report given to RN and Post -op Vital signs reviewed and stable  Post vital signs: Reviewed and stable  Last Vitals:  Vitals Value Taken Time  BP 116/74 09/26/20 1509  Temp    Pulse 69 09/26/20 1511  Resp 13 09/26/20 1511  SpO2 98 % 09/26/20 1511  Vitals shown include unvalidated device data.  Last Pain:  Vitals:   09/26/20 1144  TempSrc: Oral         Complications: No complications documented.

## 2020-09-26 NOTE — Op Note (Signed)
Sara Fowler PROCEDURE DATE: 09/26/2020  PREOPERATIVE DIAGNOSES: Intrauterine pregnancy at [redacted]w[redacted]d weeks gestation; Fetal Macrosomia, Desire for Permanent Sterilization, Multiparity  POSTOPERATIVE DIAGNOSES: The same  PROCEDURE: Primary Low Transverse Cesarean Section with Bilateral Tubal Ligation by Salpingectomy  SURGEON:  Dr. Karyl Kinnier, MD  ASSISTANT: Dr. Lynnda Shields, MD  ANESTHESIOLOGY TEAM: Anesthesiologist: Leilani Able, MD CRNA: Adria Dill, CRNA  INDICATIONS: Sara Fowler is a 35 y.o. [phone] at [redacted]w[redacted]d here for cesarean section secondary to the indications listed under preoperative diagnoses; please see preoperative note for further details.  The risks of surgery were discussed with the patient including but were not limited to: bleeding which may require transfusion or reoperation; infection which may require antibiotics; injury to bowel, bladder, ureters or other surrounding organs; injury to the fetus; need for additional procedures including hysterectomy in the event of a life-threatening hemorrhage; formation of adhesions; placental abnormalities wth subsequent pregnancies; incisional problems; thromboembolic phenomenon and other postoperative/anesthesia complications.  The patient concurred with the proposed plan, giving informed written consent for the procedure.    FINDINGS:  Viable female infant in cephalic presentation.  Apgars 9 and 10. Weight 9lb 2.8oz. Clear amniotic fluid.  Intact placenta, three vessel cord.  Normal uterus, fallopian tubes and ovaries bilaterally.  ANESTHESIA: Spinal INTRAVENOUS FLUIDS: 2,000 ml   ESTIMATED BLOOD LOSS: 450 ml URINE OUTPUT: 150 ml SPECIMENS: Placenta sent to L&D COMPLICATIONS: None immediate  PROCEDURE IN DETAIL:  The patient preoperatively received intravenous antibiotics (ancef) and had sequential compression devices applied to her lower extremities.  She was then taken to the operating room where spinal  anesthesia was administered and was found to be adequate. She was then placed in a dorsal supine position with a leftward tilt, and prepped and draped in a sterile manner.  A foley catheter was placed into her bladder and attached to constant gravity.  After an adequate timeout was performed, a Pfannenstiel skin incision was made with scalpel and carried through to the underlying layer of fascia. The fascia was incised in the midline, and this incision was extended bilaterally, bluntly.  Kocher clamps were applied to the superior aspect of the fascial incision and the underlying rectus muscles were dissected off bluntly and sharply.  A similar process was carried out on the inferior aspect of the fascial incision. The rectus muscles were separated in the midline and the peritoneum was entered sharply and bluntly. The Alexis self-retaining retractor was introduced into the abdominal cavity.  Attention was turned to the lower uterine segment where a bladder flap was created sharply and bluntly. A low transverse hysterotomy was then made with a scalpel and extended bilaterally bluntly.  The infant was successfully delivered, the cord was clamped and cut after one minute, and the infant was handed over to the awaiting neonatology team. Uterine massage was then administered, and the placenta delivered intact with a three-vessel cord. The uterus was then cleared of clots and debris.  The hysterotomy was closed with 0 Vicryl in a running locked fashion, and an imbricating layer was also placed with 0 Vicryl.    The left Fallopian tube was identified by tracing out to the fimbraie, grasped with the Babcock clamps. Salpingectomy was performed using the clamp, cut, ligate method with use of 0 plain gut suture. Good hemostasis was noted after, using electrocautery for any small bleeding vessels. Attention was then turned to the right Fallopian tube, and a similar procedure was carried out after confirmation by tracing the  tube out to the  fimbriae.  The pelvis was cleared of all clot and debris. Hemostasis was confirmed on all surfaces.  The retractor was removed.  The peritoneum was closed with a 2-0 Vicryl running stitch. The fascia was then closed using 0 Vicryl in a running fashion.  The subcutaneous layer was irrigated, and reapproximated with 2-0 plain gut interrupted stitches, and the skin was closed with a 4-0 Vicryl subcuticular stitch. Prevena wound vac was placed. The patient tolerated the procedure well. Sponge, instrument and needle counts were correct x 3.  She was taken to the recovery room in stable condition.   Sara Oats, MD OB Fellow, Faculty Practice 09/26/2020 2:50 PM

## 2020-09-26 NOTE — Discharge Summary (Signed)
Postpartum Discharge Summary     Patient Name: Sara Fowler DOB: 1986/03/19 MRN: [id number]  Date of admission: 09/26/2020 Delivery date:09/26/2020  Delivering provider: Caren Macadam  Date of discharge: 09/28/2020  Admitting diagnosis: S/P primary low transverse C-section [Z98.891] Intrauterine pregnancy: [redacted]w[redacted]d    Secondary diagnosis:  Principal Problem:   Cesarean delivery delivered Active Problems:   Gestational diabetes requiring insulin   AMA (advanced maternal age) multigravida 35+   Language barrier   Asymptomatic bacteriuria during pregnancy   LGA (large for gestational age) fetus affecting management of mother   Transient hypertension of pregnancy in third trimester   S/P primary low transverse C-section   History of bilateral tubal ligation  Additional problems: as noted above  Discharge diagnosis: Primary Cesarean delivery delivered                                           Post partum procedures:bilateral tubal ligation with salpingectomy Augmentation: N/A Complications: None  Hospital course: Sceduled C/S   35y.o. yo [id number] admitted to the hospital 09/26/2020 for scheduled cesarean section with the following indication:fetal macrosomia.Delivery details are as follows:  Membrane Rupture Time/Date: 2:06 PM ,09/26/2020   Delivery Method:C-Section, Low Transverse  Details of operation can be found in separate operative note. Postpartum bilateral tubal ligation was performed by salpingectomy. Patient had an uncomplicated postpartum course.  She is ambulating, tolerating a regular diet, passing flatus, and urinating well. She is only taking ibuprofen and tylenol for pay, and wishes to go home today.  Patient is discharged home in stable condition on  09/28/20        Newborn Data: Birth date:09/26/2020  Birth time:2:06 PM  Gender:Female  Living status:Living  Apgars:9 ,10  [phone]     Magnesium Sulfate received: No BMZ received:  No Rhophylac:N/A MMR:N/A T-DaP:Given prenatally Flu: Yes Transfusion:No  Physical exam  Vitals:   09/27/20 1000 09/27/20 1303 09/27/20 2306 09/28/20 0535  BP:  106/65 111/67 125/73  Pulse:  77 71 70  Resp: '17 17 16 16  ' Temp:  97.9 F (36.6 C) 98 F (36.7 C) 98.6 F (37 C)  TempSrc:  Oral Oral Oral  SpO2: 100% 99% 98% 100%  Weight:      Height:       General: alert, cooperative and no distress Lochia: appropriate Uterine Fundus: firm Incision: Healing well with no significant drainage, No significant erythema, Dressing is clean, dry, and intact DVT Evaluation: No evidence of DVT seen on physical exam. Negative Homan's sign. No cords or calf tenderness. No significant calf/ankle edema. Labs: Lab Results  Component Value Date   WBC 6.1 09/27/2020   HGB 10.4 (L) 09/27/2020   HCT 31.0 (L) 09/27/2020   MCV 92.0 09/27/2020   PLT 192 09/27/2020   CMP Latest Ref Rng & Units 09/26/2020  Glucose 70 - 99 mg/dL 77  BUN 6 - 20 mg/dL 10  Creatinine 0.44 - 1.00 mg/dL 0.79  Sodium 135 - 145 mmol/L 137  Potassium 3.5 - 5.1 mmol/L 3.9  Chloride 98 - 111 mmol/L 108  CO2 22 - 32 mmol/L 20(L)  Calcium 8.9 - 10.3 mg/dL 9.5  Total Protein 6.5 - 8.1 g/dL 6.3(L)  Total Bilirubin 0.3 - 1.2 mg/dL 0.6  Alkaline Phos 38 - 126 U/L 131(H)  AST 15 - 41 U/L 27  ALT 0 -  44 U/L 18   Edinburgh Score: Edinburgh Postnatal Depression Scale Screening Tool 09/26/2020  I have been able to laugh and see the funny side of things. 0  I have looked forward with enjoyment to things. 0  I have blamed myself unnecessarily when things went wrong. 0  I have been anxious or worried for no good reason. 0  I have felt scared or panicky for no good reason. 0  Things have been getting on top of me. 0  I have been so unhappy that I have had difficulty sleeping. 0  I have felt sad or miserable. 0  I have been so unhappy that I have been crying. 0  The thought of harming myself has occurred to me. 0  Edinburgh  Postnatal Depression Scale Total 0     After visit meds:  Allergies as of 09/28/2020   No Known Allergies     Medication List    STOP taking these medications   acetaminophen 325 MG tablet Commonly known as: TYLENOL   Alcohol Wipes 70 % Misc   HumuLIN N 100 UNIT/ML injection Generic drug: insulin NPH Human   HumuLIN R 100 units/mL injection Generic drug: insulin regular   Insulin Syringe-Needle U-100 30G X 5/16" 1 ML Misc   RELION INSULIN SYRINGE 1ML/31G 31G X 5/16" 1 ML Misc Generic drug: Insulin Syringe-Needle U-100     TAKE these medications   ibuprofen 800 MG tablet Commonly known as: ADVIL Take 1 tablet (800 mg total) by mouth every 6 (six) hours as needed for moderate pain.   oxyCODONE 5 MG immediate release tablet Commonly known as: Oxy IR/ROXICODONE Take 1-2 tablets (5-10 mg total) by mouth every 6 (six) hours as needed for moderate pain or severe pain.   prenatal multivitamin Tabs tablet Take 1 tablet by mouth daily.        Discharge home in stable condition Infant Feeding: breast and bottle Infant Disposition:home with mother Discharge instruction: per After Visit Summary and Postpartum booklet. Activity: Advance as tolerated. Pelvic rest for 6 weeks.  Diet: routine diet Future Appointments: Future Appointments  Date Time Provider [place]  10/03/2020 10:20 AM [facility] NURSE [facility]  11/08/2020  8:20 AM [facility]  11/08/2020  9:35 AM Caren Macadam, MD [facility] [place] [facility]   Follow up Visit: Message sent to [facility] by Dr. Astrid Drafts  Please schedule this patient for a In person postpartum visit in 6 weeks with the following provider: any provider. Additional Postpartum F/U:2 hour GTT and Incision check 1 week  High risk pregnancy complicated by: fetal macrosomia, A2GDM (insulin) Delivery mode:  C-Section, Low Transverse  Anticipated Birth Control:  BTL done [facility]   09/28/2020 Christin Fudge, CNM

## 2020-09-26 NOTE — Progress Notes (Signed)
Hypoglycemic Event  CBG: 65  Treatment: 4 oz juice/soda  Symptoms: None   Follow-up CBG: Time:1530 CBG Result:69  Possible Reasons for Event: Inadequate meal intake  Comments/MD notified: MD notified    Sara Fowler

## 2020-09-26 NOTE — Progress Notes (Signed)
Hypoglycemic Event  CBG: 69  Treatment: 4 oz juice/soda  Symptoms: None  Follow-up CBG: Time:1548 CBG Result:73  Possible Reasons for Event: Inadequate meal intake  Comments/MD notified: RN aware    Sara Fowler

## 2020-09-27 DIAGNOSIS — O24434 Gestational diabetes mellitus in the puerperium, insulin controlled: Secondary | ICD-10-CM | POA: Diagnosis not present

## 2020-09-27 DIAGNOSIS — O135 Gestational [pregnancy-induced] hypertension without significant proteinuria, complicating the puerperium: Secondary | ICD-10-CM | POA: Diagnosis not present

## 2020-09-27 DIAGNOSIS — O9903 Anemia complicating the puerperium: Secondary | ICD-10-CM

## 2020-09-27 DIAGNOSIS — D649 Anemia, unspecified: Secondary | ICD-10-CM | POA: Diagnosis not present

## 2020-09-27 LAB — CBC
HCT: 31 % — ABNORMAL LOW (ref 36.0–46.0)
Hemoglobin: 10.4 g/dL — ABNORMAL LOW (ref 12.0–15.0)
MCH: 30.9 pg (ref 26.0–34.0)
MCHC: 33.5 g/dL (ref 30.0–36.0)
MCV: 92 fL (ref 80.0–100.0)
Platelets: 192 10*3/uL (ref 150–400)
RBC: 3.37 MIL/uL — ABNORMAL LOW (ref 3.87–5.11)
RDW: 13.9 % (ref 11.5–15.5)
WBC: 6.1 10*3/uL (ref 4.0–10.5)
nRBC: 0 % (ref 0.0–0.2)

## 2020-09-27 LAB — GLUCOSE, CAPILLARY: Glucose-Capillary: 79 mg/dL (ref 70–99)

## 2020-09-27 NOTE — Lactation Note (Addendum)
This note was copied from a baby's chart. Lactation Consultation Note  Patient Name: Sara Fowler [id number] Date: 09/27/2020 Reason for consult: Follow-up assessment;Early term 37-38.6wks Age:37 hours  Consult was done in Spanish:  Follow up to 28 hours old infant with 1.90% weight loss at the time of visit. Infant is cueing next to mother at time of arrival. Per mother, infant took ~40 mL of formula via bottle last feeding. Mother states she is putting baby to breast but she has no milk yet. Mother explains she has breast and formula fed this way all her children. LC offered assistance with latch following cues. Mother declined assistance.  Talked about formula volume guidelines according to infant's age. Discussed pacing while bottle-feeding, demonstrated technique. Reinforced upright position and frequent burping.    Feeding plan:  1. Feed infant 8-12 time in 24h or following hunger cues.  2. Formula feed following volume guidelines, paced bottle feeding and fullness cues.   3. Pump or hand-express everytime infant is supplemented with formula  4. Monitor voids and stools as signs good intake 5. Encouraged maternal rest, hydration and food intake.  6. Contact Lactation Services or local resources for support, questions or concerns.     All questions answered at this time.    Feeding Mother's Current Feeding Choice: Breast Milk and Formula Nipple Type: Extra Slow Flow  Interventions Interventions: Breast feeding basics reviewed;Skin to skin;Hand express;Education  Consult Status Consult Status: Follow-up Date: 09/28/20 Follow-up type: In-patient    Amelio Brosky A Higuera Ancidey 09/27/2020, 6:09 PM

## 2020-09-27 NOTE — Progress Notes (Addendum)
Subjective: Postpartum Day 1: Cesarean Delivery  Patient is doing well without complaints. Patient however, is not ambulating. Still has compression socks and last night felt dizzy when walking to the bathroom. No nausea or vomiting. Patient has yet not voided since removal of foley and has not passed flatus. Tolerating PO. Abdominal pain improved, rated as 0/10. Vaginal bleeding described as a moderate period but noted it has improved.   Objective: Vital signs in last 24 hours: Temp:  [97.7 F (36.5 C)-98.8 F (37.1 C)] 98 F (36.7 C) (04/14 0445) Pulse Rate:  [58-87] 61 (04/14 0445) Resp:  [14-23] 16 (04/14 0445) BP: (107-140)/(14-85) 109/69 (04/14 0445) SpO2:  [95 %-100 %] 100 % (04/14 0445) Weight:  [110.5 kg] 110.5 kg (04/13 1144)  Physical Exam:  General: alert, cooperative and no distress Lochia: appropriate Uterine Fundus: difficult to feel Incision: clean, dry, intact DVT Evaluation: No evidence of DVT seen on physical exam.  Recent Labs    09/24/20 1115 09/27/20 0534  HGB 12.5 10.4*  HCT 37.0 31.0*    Assessment/Plan: Status post Cesarean section. Doing well postoperatively.  #Language Barrier - needs spanish interpreter #MOF: Both  #GDM - recheck blood sugar  #Transient HBP on admission - continue to monitor BP #Anemia of pregnancy - Hgb 10.4 - supplement PO iron q48h #Not met postpartum milestones - recheck void, flatus, ambulating #Circumsicion - declined #Continue current care.  Sara Fowler 09/27/2020, 8:00 AM

## 2020-09-28 ENCOUNTER — Other Ambulatory Visit: Payer: Self-pay | Admitting: Student

## 2020-09-28 DIAGNOSIS — O135 Gestational [pregnancy-induced] hypertension without significant proteinuria, complicating the puerperium: Secondary | ICD-10-CM | POA: Diagnosis not present

## 2020-09-28 DIAGNOSIS — O9903 Anemia complicating the puerperium: Secondary | ICD-10-CM | POA: Diagnosis not present

## 2020-09-28 DIAGNOSIS — D649 Anemia, unspecified: Secondary | ICD-10-CM | POA: Diagnosis not present

## 2020-09-28 DIAGNOSIS — O24434 Gestational diabetes mellitus in the puerperium, insulin controlled: Secondary | ICD-10-CM | POA: Diagnosis not present

## 2020-09-28 LAB — SURGICAL PATHOLOGY

## 2020-09-28 MED ORDER — OXYCODONE HCL 5 MG PO TABS
5.0000 mg | ORAL_TABLET | Freq: Four times a day (QID) | ORAL | 0 refills | Status: DC | PRN
  Filled 2020-09-28: qty 15, 2d supply, fill #0

## 2020-09-28 MED ORDER — IBUPROFEN 800 MG PO TABS: 800.0000 mg | ORAL_TABLET | Freq: Three times a day (TID) | ORAL | 1 refills | Status: DC | PRN

## 2020-09-28 MED ORDER — OXYCODONE HCL 5 MG PO TABS: 5.0000 mg | ORAL_TABLET | ORAL | 0 refills | Status: DC | PRN

## 2020-09-28 MED ORDER — IBUPROFEN 800 MG PO TABS
800.0000 mg | ORAL_TABLET | Freq: Four times a day (QID) | ORAL | 0 refills | Status: DC | PRN
  Filled 2020-09-28: qty 30, 8d supply, fill #0

## 2020-09-28 MED ORDER — COVID-19 MRNA VACC (MODERNA) 100 MCG/0.5ML IM SUSP
0.5000 mL | Freq: Once | INTRAMUSCULAR | Status: AC
  Administered 2020-09-28: 0.5 mL via INTRAMUSCULAR
  Filled 2020-09-28: qty 0.5

## 2020-09-28 NOTE — Lactation Note (Addendum)
This note was copied from a baby's chart. Lactation Consultation Note  Patient Name: Sara Fowler [id number] Date: 09/28/2020 Reason for consult: Follow-up assessment;Early term 37-38.6wks Age:37 hours Consult was done in Spanish:  Follow up visit to 48 hours old with 2.86% weight loss of a P4 mother with breastfeeding experience. Per mother, infant just finished feeding and took 71mL of formula. Mother states "had to breastfeed for 10 minutes because I did not have formula available in her room and infant was cueing". Infant has been been having good voids and stools, per mother. Reinforced attention to hunger and fullness cues as well as follow supplementation volume guidelines according to age. Feeding plan:  1. Breastfeed following hunger cues or 8 - 12 times in 24h period 2. Stimulate infant awake at the breast by massaging shoulders, arms, feet, etc 3. If supplementing, follow formula following guidelines, pace bottle feeding, frequent burping and, upright position. 4. Monitor voids and stools for signs of good intake 5. Encouraged maternal rest, hydration and food intake.  6. Contact Lactation Services or local resources for support, questions or concerns.    All questions answered at this time. Family is waiting to be discharged home today.   Feeding Mother's Current Feeding Choice: Breast Milk and Formula Nipple Type: Extra Slow Flow  LATCH Score Latch:  (LC did not observed)  Audible Swallowing:  (LC did not observed)  Type of Nipple:  (LC did not observed)  Comfort (Breast/Nipple):  (LC did not observed)  Hold (Positioning):  (LC did not observed)  Lactation Tools Discussed/Used Tools: Pump Breast pump type: Manual Pumping frequency: as needed  Interventions Interventions: Breast feeding basics reviewed;Skin to skin;Hand express;Breast massage;Hand pump;Expressed milk;Education  Discharge Discharge Education: Engorgement and breast care;Warning signs  for feeding baby Pump: Manual WIC Program: Yes  Consult Status Consult Status: Complete Date: 09/28/20 Follow-up type: Call as needed   Makyna Niehoff A Higuera Ancidey 09/28/2020, 2:52 PM

## 2020-09-28 NOTE — Progress Notes (Signed)
[facility] is closed on Good Friday so RN requested that RX be sent to [facility] on [address]. RX to [facility] was d/c'ed and new RX sent to [facility].   Luna Kitchens

## 2020-09-28 NOTE — Progress Notes (Signed)
Spoke to [facility] about sending RX for Ibuprofen and Oxycodone who states she will be able to do this.  To be sent to [facility] rx on [address]

## 2020-10-01 ENCOUNTER — Other Ambulatory Visit: Payer: Self-pay

## 2020-10-03 ENCOUNTER — Ambulatory Visit (INDEPENDENT_AMBULATORY_CARE_PROVIDER_SITE_OTHER): Payer: Self-pay

## 2020-10-03 VITALS — BP 126/81 | HR 98 | Wt 222.5 lb

## 2020-10-03 DIAGNOSIS — Z5189 Encounter for other specified aftercare: Secondary | ICD-10-CM

## 2020-10-03 NOTE — Progress Notes (Signed)
Video Interpreter # Roanna Banning [phone]  Pt here today for removal of wound vac and incision check s/p primary c-section on 09/26/20.  Pt reports mild pain in the abdomen with no incisional pain.  Pt also reports scant vaginal bleeding.  BP 126/81. Observed [place] wound vac and removed successfully.  Pt tolerated well.  Incision observed well approximated- no odor, no edema, no drainage, and no erythema.  There is a 5 mm section on left of incision that is a little open.  I advised pt to continue to monitor that area however it should heal as it should.  Pt advised to allow warm soapy water to run over the incision and pat dry.  I encouraged pt to use ibuprofen for the pain and to help with incisional healing.  Pt verbalized understanding with no further questions.   Leonette Nutting  10/03/20

## 2020-10-04 NOTE — Progress Notes (Signed)
Patient was assessed and managed by nursing staff during this encounter. I have reviewed the chart and agree with the documentation and plan. I have also made any necessary editorial changes.  Santhosh Gulino A Samer Dutton, MD 10/04/2020 8:21 AM   

## 2020-10-09 NOTE — Anesthesia Postprocedure Evaluation (Signed)
Anesthesia Post Note  Patient: Buyer, retail  Procedure(s) Performed: CESAREAN SECTION WITH BILATERAL TUBAL LIGATION (N/A Abdomen)     Patient location during evaluation: PACU Anesthesia Type: Spinal Level of consciousness: awake Pain management: pain level controlled Vital Signs Assessment: post-procedure vital signs reviewed and stable Respiratory status: spontaneous breathing Cardiovascular status: stable Postop Assessment: no backache, no headache, spinal receding, patient able to bend at knees and no apparent nausea or vomiting Anesthetic complications: no   No complications documented.  Last Vitals:  Vitals:   09/27/20 2306 09/28/20 0535  BP: 111/67 125/73  Pulse: 71 70  Resp: 16 16  Temp: 36.7 C 37 C  SpO2: 98% 100%    Last Pain:  Vitals:   09/28/20 0841  TempSrc:   PainSc: 3                  John F Scharlene Corn

## 2020-10-19 ENCOUNTER — Telehealth: Payer: Self-pay | Admitting: Lactation Services

## 2020-10-19 NOTE — Telephone Encounter (Signed)
Received call from Perlie Mayo, RN from [facility] that patient was seen yesterday at home.   She reports BP 126/78 Temp 98.4 HR 72 RR 16  She has no concerns with incision are Infant is BF well Edinburgh 0 Referral made to [facility] at [facility], Spanish support group.   She asked [place] to call if we had any questions.

## 2020-11-05 ENCOUNTER — Other Ambulatory Visit: Payer: Self-pay | Admitting: General Practice

## 2020-11-05 DIAGNOSIS — O0990 Supervision of high risk pregnancy, unspecified, unspecified trimester: Secondary | ICD-10-CM

## 2020-11-08 ENCOUNTER — Other Ambulatory Visit: Payer: Self-pay

## 2020-11-08 ENCOUNTER — Ambulatory Visit (INDEPENDENT_AMBULATORY_CARE_PROVIDER_SITE_OTHER): Payer: Self-pay | Admitting: Family Medicine

## 2020-11-08 ENCOUNTER — Encounter: Payer: Self-pay | Admitting: Family Medicine

## 2020-11-08 VITALS — BP 120/81 | HR 68 | Ht 62.0 in | Wt 221.0 lb

## 2020-11-08 DIAGNOSIS — Z5941 Food insecurity: Secondary | ICD-10-CM

## 2020-11-08 DIAGNOSIS — O0990 Supervision of high risk pregnancy, unspecified, unspecified trimester: Secondary | ICD-10-CM

## 2020-11-08 NOTE — Progress Notes (Signed)
Post Partum Visit Note  Sara Fowler is a 35 y.o. [phone] female who presents for a postpartum visit. She is 6 weeks postpartum following a LTCS.  I have fully reviewed the prenatal and intrapartum course. The delivery was at [redacted]w[redacted]d gestational weeks.  Anesthesia: epidural. Postpartum course has unremarkable . Baby is doing well. Baby is feeding by both breast and bottle [facility]). Bleeding no bleeding. Bowel function is normal. Bladder function is normal. Patient is sexually active. Contraception method is tubal ligation. Postpartum depression screening: negative.  The pregnancy intention screening data noted above was reviewed. Potential methods of contraception were discussed. The patient already has a bilateral tubal ligation.   Edinburgh Postnatal Depression Scale - 11/08/20 0903      Edinburgh Postnatal Depression Scale:  In the Past 7 Days   I have been able to laugh and see the funny side of things. 0    I have looked forward with enjoyment to things. 0    I have blamed myself unnecessarily when things went wrong. 0    I have been anxious or worried for no good reason. 0    I have felt scared or panicky for no good reason. 0    Things have been getting on top of me. 0    I have been so unhappy that I have had difficulty sleeping. 0    I have felt sad or miserable. 0    I have been so unhappy that I have been crying. 0    The thought of harming myself has occurred to me. 0    Edinburgh Postnatal Depression Scale Total 0           Health Maintenance Due  Topic Date Due  . URINE MICROALBUMIN  Never done  . TETANUS/TDAP  Never done  . COVID-19 Vaccine (2 - Moderna 3-dose series) 10/26/2020    Review of Systems Pertinent items noted in HPI and remainder of comprehensive ROS otherwise negative.  Objective:  BP 120/81   Pulse 68   Ht 5\' 2"  (1.575 m)   Wt 221 lb (100.2 kg)   LMP 01/03/2020   Breastfeeding Yes   BMI 40.42 kg/m    General:  alert,  cooperative and no distress   Breasts:  not indicated  Lungs: clear to auscultation bilaterally  Heart:  regular rate and rhythm, S1, S2 normal, no murmur, click, rub or gallop  Abdomen: deferred   Wound Incision intact, no pain at site, healing well  GU exam:  deferred       Assessment:    1. Food insecurity: Pt endorses some food insecurity. Connected to 01/05/2020.   2. Lower Extremity Edema: Pt endorses some bilateral edema with veinous congestion. No edema present during visit today. Counseled pt on frequent movement and positional changes and using compression socks.  Otherwise normal postpartum exam.   Plan:   Essential components of care per ACOG recommendations:  1.  Mood and well being: Patient with negative depression screening today. Reviewed local resources for support.  - Patient tobacco use? No.   - hx of drug use? No.    2. Infant care and feeding:  -Patient currently breastmilk feeding? Yes. Reviewed importance of draining breast regularly to support lactation. -Social determinants of health (SDOH) reviewed in EPIC. The following needs were identified: food insecurity. Connected to food market.  3. Sexuality, contraception and birth spacing - Patient does not want a pregnancy in the next year.  Desired  family size is 4 children.  - Reviewed forms of contraception in tiered fashion. Patient already has bilateral tubal ligation.    4. Sleep and fatigue -Encouraged family/partner/community support of 4 hrs of uninterrupted sleep to help with mood and fatigue  5. Physical Recovery  - Discussed patients delivery and complications. She describes her labor as good. - Patient had a C-section.  - Patient has urinary incontinence? No. - Patient is safe to resume physical and sexual activity  6.  Health Maintenance - HM due items addressed Yes - Last pap smear was negative on 06/04/20  Pap smear was not done at today's visit.  -Breast Cancer screening indicated? No.    7. Chronic Disease/Pregnancy Condition follow up: Gestational Diabetes. Getting a 2hr GTT today.  - PCP follow up  Byron Peacock B Irving Burton, [facility] [employer], [facility]

## 2020-11-08 NOTE — Progress Notes (Signed)
    Post Partum Visit Note-- see attestation on student note.

## 2020-11-09 LAB — GLUCOSE TOLERANCE, 2 HOURS
Glucose, 2 hour: 106 mg/dL (ref 65–139)
Glucose, GTT - Fasting: 100 mg/dL — ABNORMAL HIGH (ref 65–99)

## 2020-11-10 ENCOUNTER — Encounter: Payer: Self-pay | Admitting: Family Medicine

## 2020-11-13 ENCOUNTER — Telehealth: Payer: Self-pay | Admitting: *Deleted

## 2020-11-13 ENCOUNTER — Other Ambulatory Visit: Payer: Self-pay

## 2020-11-13 DIAGNOSIS — R52 Pain, unspecified: Secondary | ICD-10-CM

## 2020-11-13 MED ORDER — IBUPROFEN 800 MG PO TABS
800.0000 mg | ORAL_TABLET | Freq: Three times a day (TID) | ORAL | 0 refills | Status: AC | PRN
  Filled 2020-11-13: qty 30, 10d supply, fill #0

## 2020-11-13 NOTE — Telephone Encounter (Signed)
-----   Message from Federico Flake, MD sent at 11/10/2020  5:11 PM EDT ----- Fasting is elevated but not above 120 which is the threshold for non-pregnant. 2hr wnl. Recommend annual diabetes screening to assure she does not develop type 2 diabetes. She is at risk for this given her diabetes in pregnancy.

## 2020-11-13 NOTE — Telephone Encounter (Signed)
I called Sara Fowler with Interpereter [name] and informed her results and recommendations per Dr. Alvester Morin. She voices understanding. She also asked for refill of her ibuprofen for occasional pain at incision site. Denies any issues with wound and states it has healed well. Refill send in per protocol. Cleota Pellerito,RN

## 2020-11-14 ENCOUNTER — Other Ambulatory Visit: Payer: Self-pay

## 2021-05-23 ENCOUNTER — Other Ambulatory Visit: Payer: Self-pay

## 2021-05-23 DIAGNOSIS — R1030 Lower abdominal pain, unspecified: Secondary | ICD-10-CM | POA: Insufficient documentation

## 2021-05-23 DIAGNOSIS — R739 Hyperglycemia, unspecified: Secondary | ICD-10-CM | POA: Insufficient documentation

## 2021-05-24 ENCOUNTER — Encounter (HOSPITAL_COMMUNITY): Payer: Self-pay | Admitting: Emergency Medicine

## 2021-05-24 ENCOUNTER — Other Ambulatory Visit: Payer: Self-pay

## 2021-05-24 ENCOUNTER — Emergency Department (HOSPITAL_COMMUNITY)
Admission: EM | Admit: 2021-05-24 | Discharge: 2021-05-24 | Disposition: A | Discharge status: Home / Self Care | Payer: Self-pay | Attending: Emergency Medicine | Admitting: Emergency Medicine

## 2021-05-24 DIAGNOSIS — R739 Hyperglycemia, unspecified: Secondary | ICD-10-CM

## 2021-05-24 DIAGNOSIS — R1030 Lower abdominal pain, unspecified: Secondary | ICD-10-CM

## 2021-05-24 DIAGNOSIS — R7989 Other specified abnormal findings of blood chemistry: Secondary | ICD-10-CM

## 2021-05-24 LAB — COMPREHENSIVE METABOLIC PANEL
ALT: 105 U/L — ABNORMAL HIGH (ref 0–44)
AST: 45 U/L — ABNORMAL HIGH (ref 15–41)
Albumin: 3.5 g/dL (ref 3.5–5.0)
Alkaline Phosphatase: 114 U/L (ref 38–126)
Anion gap: 6 (ref 5–15)
BUN: 19 mg/dL (ref 6–20)
CO2: 26 mmol/L (ref 22–32)
Calcium: 8.8 mg/dL — ABNORMAL LOW (ref 8.9–10.3)
Chloride: 102 mmol/L (ref 98–111)
Creatinine, Ser: 1.21 mg/dL — ABNORMAL HIGH (ref 0.44–1.00)
GFR, Estimated: 59 mL/min — ABNORMAL LOW (ref >60)
Glucose, Bld: 210 mg/dL — ABNORMAL HIGH (ref 70–99)
Potassium: 4.1 mmol/L (ref 3.5–5.1)
Sodium: 134 mmol/L — ABNORMAL LOW (ref 135–145)
Total Bilirubin: 0.3 mg/dL (ref 0.3–1.2)
Total Protein: 6.9 g/dL (ref 6.5–8.1)

## 2021-05-24 LAB — I-STAT BETA HCG BLOOD, ED (MC, WL, AP ONLY): I-stat hCG, quantitative: <5 m[IU]/mL (ref <5)

## 2021-05-24 LAB — CBC WITH DIFFERENTIAL/PLATELET
Abs Immature Granulocytes: 0.01 10*3/uL (ref 0.00–0.07)
Basophils Absolute: 0 10*3/uL (ref 0.0–0.1)
Basophils Relative: 0 %
Eosinophils Absolute: 0.1 10*3/uL (ref 0.0–0.5)
Eosinophils Relative: 1 %
HCT: 38.7 % (ref 36.0–46.0)
Hemoglobin: 12.7 g/dL (ref 12.0–15.0)
Immature Granulocytes: 0 %
Lymphocytes Relative: 42 %
Lymphs Abs: 3.1 10*3/uL (ref 0.7–4.0)
MCH: 29.6 pg (ref 26.0–34.0)
MCHC: 32.8 g/dL (ref 30.0–36.0)
MCV: 90.2 fL (ref 80.0–100.0)
Monocytes Absolute: 0.6 10*3/uL (ref 0.1–1.0)
Monocytes Relative: 8 %
Neutro Abs: 3.5 10*3/uL (ref 1.7–7.7)
Neutrophils Relative %: 49 %
Platelets: 295 10*3/uL (ref 150–400)
RBC: 4.29 MIL/uL (ref 3.87–5.11)
RDW: 12.8 % (ref 11.5–15.5)
WBC: 7.2 10*3/uL (ref 4.0–10.5)
nRBC: 0 % (ref 0.0–0.2)

## 2021-05-24 LAB — URINALYSIS, MICROSCOPIC (REFLEX): Bacteria, UA: NONE SEEN

## 2021-05-24 LAB — URINALYSIS, ROUTINE W REFLEX MICROSCOPIC
Bilirubin Urine: NEGATIVE
Glucose, UA: >=500 mg/dL — AB
Ketones, ur: NEGATIVE mg/dL
Leukocytes,Ua: NEGATIVE
Nitrite: NEGATIVE
Protein, ur: NEGATIVE mg/dL
Specific Gravity, Urine: 1.02 (ref 1.005–1.030)
pH: 6.5 (ref 5.0–8.0)

## 2021-05-24 NOTE — ED Triage Notes (Signed)
Patient here with pain where her c section scar is.  She has had a tubal ligation.  She is having abdominal pain, no nausea or vomiting.

## 2021-05-24 NOTE — ED Provider Notes (Signed)
Emergency Medicine Provider Triage Evaluation Note  Sara Fowler , a 35 y.o. female  was evaluated in triage.  Pt complains of pain across her C-section line for the past week.  Denies nausea, vomiting, dysuria.  C-section was in April of this year.  Review of Systems  Positive: Lower abdominal pain Negative: Changes of bowel or bladder habits, fevers, chills, nausea, vomiting  Physical Exam  BP (!) 101/58 (BP Location: Right Arm)   Pulse 82   Temp 98.6 F (37 C) (Oral)   Resp 12   SpO2 99%  Gen:   Awake, no distress   Resp:  Normal effort  MSK:   Moves extremities without difficulty  Other:  Abdomen is soft and nontender.  C-section incision appears to be well-healed, no erythema, no dehiscence.  Medical Decision Making  Medically screening exam initiated at 12:25 AM.  Appropriate orders placed.  [name] was informed that the remainder of the evaluation will be completed by another provider, this initial triage assessment does not replace that evaluation, and the importance of remaining in the ED until their evaluation is complete.     Jeannie Fend, PA-C 05/24/21 0026    Nira Conn, MD 05/24/21 [phone]

## 2021-05-24 NOTE — Discharge Instructions (Signed)
Follow-up with women's clinic as discussed for recheck. Avoid Tylenol and alcohol.  Your doctor can recheck your blood work. Use your glucometer use your glucometer to monitor your blood sugar.  Your blood sugar is elevated today which might be due to drinking soda.  Consider these options for your care in the future:   Walk-ins for certain complaints available at:   [facility] [address]  [phone]  See hours at [email or web address]   Center for [employer] at [employer] [facility]  [address]  [phone]   [facility] for [employer] at [employer]  [phone]   You can make an appointment to see a GYN provider:   Center for [facility] at [facility]  [address]  [phone]   Center for [facility] at [facility]  [address] [employer]  [phone]   Center for [facility] Healthcare at [facility]  [phone]   Center for [facility] Healthcare at [facility]  [address]  [phone]   If you already have an established OB/GYN provider in the area you can make an appointment with them as well.

## 2021-05-24 NOTE — ED Provider Notes (Signed)
[facility] EMERGENCY DEPARTMENT Provider Note   CSN: [id number] Arrival date & time: 05/23/21  2330     History Chief Complaint  Patient presents with   Abdominal Pain    Sara Fowler is a 35 y.o. female.  Pt complains of pain across her C-section line for the past week.  Denies nausea, vomiting, dysuria.  C-section was in April of this year.      Past Medical History:  Diagnosis Date   Abscess of breast    Right   COVID-19    Gestational diabetes    Malpositioned IUD 12/2016   Stress headaches     Patient Active Problem List   Diagnosis Date Noted   History of bilateral tubal ligation 09/26/2020   Language barrier 08/14/2020   History of gestational diabetes 07/12/2020    Past Surgical History:  Procedure Laterality Date   CESAREAN SECTION WITH BILATERAL TUBAL LIGATION N/A 09/26/2020   Procedure: CESAREAN SECTION WITH BILATERAL TUBAL LIGATION;  Surgeon: Federico Flake, MD;  Location: [facility];  Service: Obstetrics;  Laterality: N/A;   HYSTEROSCOPY WITH D & C N/A 01/14/2017   Procedure: DILATATION AND CURETTAGE /HYSTEROSCOPY W/ IUD REMOVAL;  Surgeon: Hermina Staggers, MD;  Location: [facility];  Service: Gynecology;  Laterality: N/A;   NO PAST SURGERIES       OB History     Gravida  4   Para  4   Term  4   Preterm  0   AB  0   Living  4      SAB  0   IAB  0   Ectopic  0   Multiple  0   Live Births  4        Obstetric Comments  Largest prior was 8.5 lbs         Family History  Problem Relation Age of Onset   Asthma Daughter     Social History   Tobacco Use   Smoking status: Never   Smokeless tobacco: Never  Vaping Use   Vaping Use: Never used  Substance Use Topics   Alcohol use: Not Currently    Comment: occasionally   Drug use: Never    Home Medications Prior to Admission medications   Medication Sig Start Date End Date Taking? Authorizing Provider  ibuprofen (ADVIL)  800 MG tablet Take 1 tablet (800 mg total) by mouth every 8 (eight) hours as needed. 11/13/20   Gita Kudo, MD  Prenatal Vit-Fe Fumarate-FA (PRENATAL MULTIVITAMIN) TABS tablet Take 1 tablet by mouth daily.    [provider]    Allergies    Patient has no known allergies.  Review of Systems   Review of Systems  Constitutional:  Negative for fever.  Respiratory:  Negative for shortness of breath.   Cardiovascular:  Negative for chest pain.  Gastrointestinal:  Positive for abdominal pain. Negative for constipation, diarrhea, nausea and vomiting.  Genitourinary:  Negative for dysuria and frequency.  Musculoskeletal:  Negative for arthralgias and myalgias.  Skin:  Negative for rash and wound.  Allergic/Immunologic: Negative for immunocompromised state.  Neurological:  Negative for weakness.  Hematological:  Negative for adenopathy.  Psychiatric/Behavioral:  Negative for confusion.   All other systems reviewed and are negative.  Physical Exam Updated Vital Signs BP 123/74 (BP Location: Right Arm)   Pulse 68   Temp 98.3 F (36.8 C) (Oral)   Resp 14   SpO2 99%   Physical Exam  Vitals and nursing note reviewed.  Constitutional:      General: She is not in acute distress.    Appearance: She is well-developed. She is obese. She is not diaphoretic.  HENT:     Head: Normocephalic and atraumatic.  Cardiovascular:     Rate and Rhythm: Normal rate and regular rhythm.     Heart sounds: Normal heart sounds.  Pulmonary:     Effort: Pulmonary effort is normal.     Breath sounds: Normal breath sounds.  Abdominal:     Palpations: Abdomen is soft.     Tenderness: There is no abdominal tenderness.     Hernia: No hernia is present.  Skin:    General: Skin is warm and dry.     Findings: No erythema or rash.  Neurological:     Mental Status: She is alert and oriented to person, place, and time.  Psychiatric:        Behavior: Behavior normal.    ED Results / Procedures /  Treatments   Labs (all labs ordered are listed, but only abnormal results are displayed) Labs Reviewed  COMPREHENSIVE METABOLIC PANEL - Abnormal; Notable for the following components:      Result Value   Sodium 134 (*)    Glucose, Bld 210 (*)    Creatinine, Ser 1.21 (*)    Calcium 8.8 (*)    AST 45 (*)    ALT 105 (*)    GFR, Estimated 59 (*)    All other components within normal limits  URINALYSIS, ROUTINE W REFLEX MICROSCOPIC - Abnormal; Notable for the following components:   Glucose, UA >=500 (*)    Hgb urine dipstick TRACE (*)    All other components within normal limits  CBC WITH DIFFERENTIAL/PLATELET  URINALYSIS, MICROSCOPIC (REFLEX)  I-STAT BETA HCG BLOOD, ED ([facility], [facility], [facility] ONLY)    EKG None  Radiology No results found.  Procedures Procedures   Medications Ordered in ED Medications - No data to display  ED Course  I have reviewed the triage vital signs and the nursing notes.  Pertinent labs & imaging results that were available during my care of the patient were reviewed by me and considered in my medical decision making (see chart for details).  Clinical Course as of 05/24/21 0403  Fri May 24, 2021  2251 35 year old female with complaint of pain along her C-section line, concerned that she may have infection here.  The, C-section was 8 months ago.  On exam, surgical scar is well-healed, there is no erythema, no tenderness in this area. Labs are reviewed, CBC is unremarkable, normal white blood cell count.  Patient is not pregnant.  CMP with a glucose of 210, patient is currently drinking soda.  LFTs are mildly elevated at 45 and 105, states that she does take Tylenol at times.  Urinalysis is positive for glucose. Discussed results with patient, recommend avoiding Tylenol and alcohol and follow-up with her doctor for lab recheck.  She does have a glucometer at home, she can monitor her blood sugar. [LM]  0403 Abdomen is soft and nontender, doubt tubo-ovarian abscess,  ectopic pregnancy (negative pregnancy test), appendicitis, ovarian torsion.  Patient is overall well-appearing and afebrile.  Vitals reviewed, her O2 sat is 100% on room air. [LM]    Clinical Course User Index [LM] Alden Hipp   MDM Rules/Calculators/A&P  Final Clinical Impression(s) / ED Diagnoses Final diagnoses:  Lower abdominal pain  Hyperglycemia  Elevated LFTs    Rx / DC Orders ED Discharge Orders     None        Jeannie Fend, PA-C 05/24/21 0403    Nira Conn, MD 05/24/21 [phone]

## 2021-05-27 ENCOUNTER — Telehealth: Payer: Self-pay

## 2021-05-27 DIAGNOSIS — Z0000 Encounter for general adult medical examination without abnormal findings: Secondary | ICD-10-CM

## 2021-05-27 NOTE — Telephone Encounter (Addendum)
Called pt in regards to her concern of bleeding.  Pt reports that she went 4 months without period then had one in November.  With Spanish Interpreter Raquel M., pt reports that she started period recently and it is heavier than usual.  She reports changing her pad 2-3 times a day.  Pt states that she is having pain in the lower abdomen however ibuprofen is effective for pain.  Pt also states that she is having swelling in both her legs.  Pt's appt rescheduled to 06/19/21 as that is the soonest appt with the holidays.  Pt verbalized understanding with on further questions.   Leonette Nutting

## 2021-06-19 ENCOUNTER — Ambulatory Visit: Payer: Self-pay | Admitting: Family Medicine

## 2021-07-09 ENCOUNTER — Ambulatory Visit: Payer: Self-pay | Admitting: Family Medicine

## 2021-07-26 ENCOUNTER — Ambulatory Visit: Payer: Self-pay | Admitting: Family Medicine

## 2022-02-04 IMAGING — US US MFM OB DETAIL+14 WK
1 series · 13 of 28 positions shown · non-contrast
Comparison: none

[Series 1: us mfm ob detail+14 wk · 121 acquisitions, 13 frames shown]
[im 5/121]
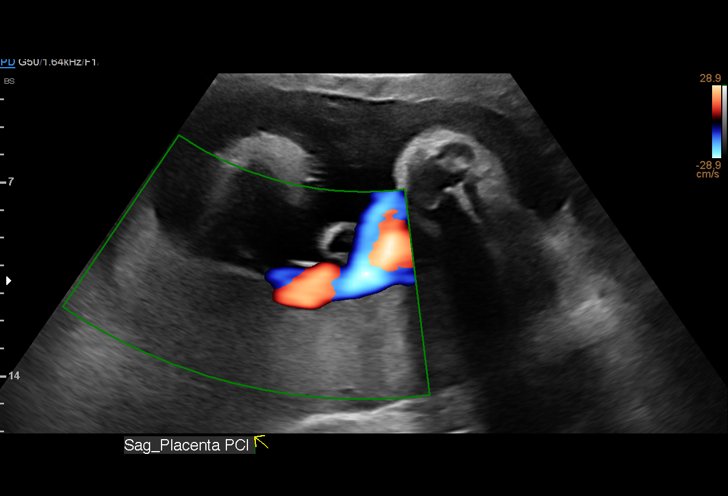
[im 14/121]
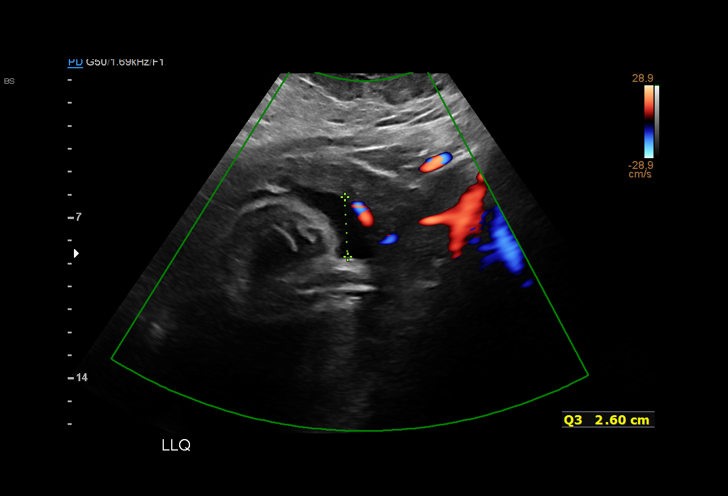
[im 23/121]
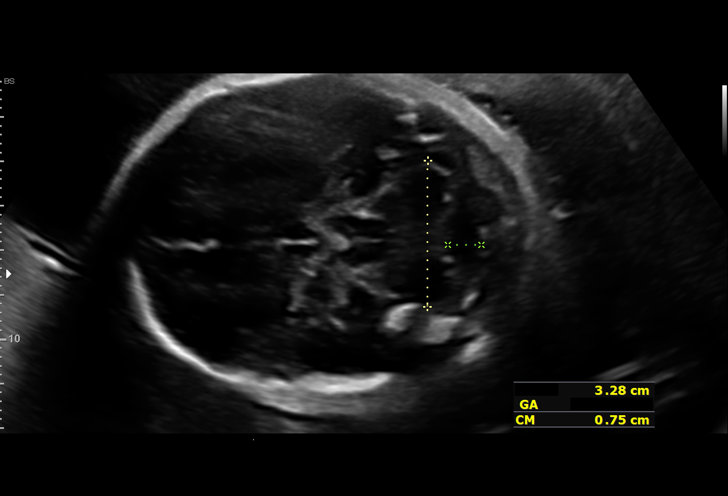
[im 32/121]
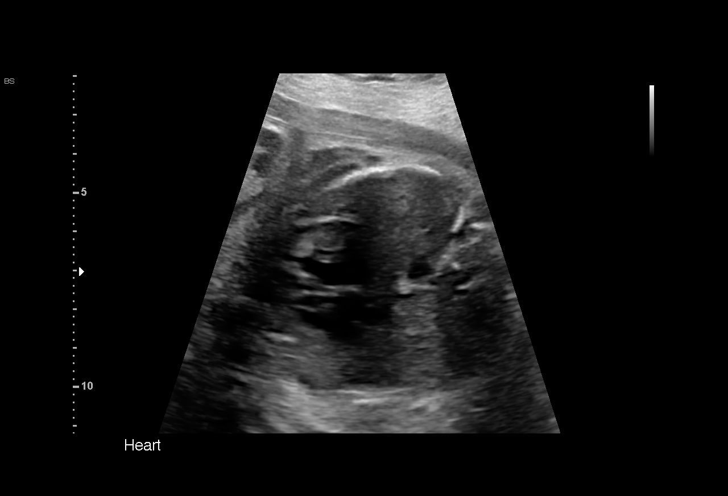
[im 41/121]
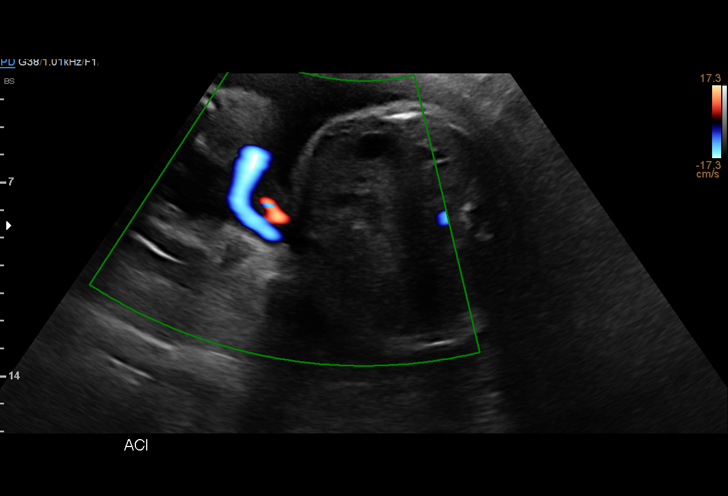
[im 49/121]
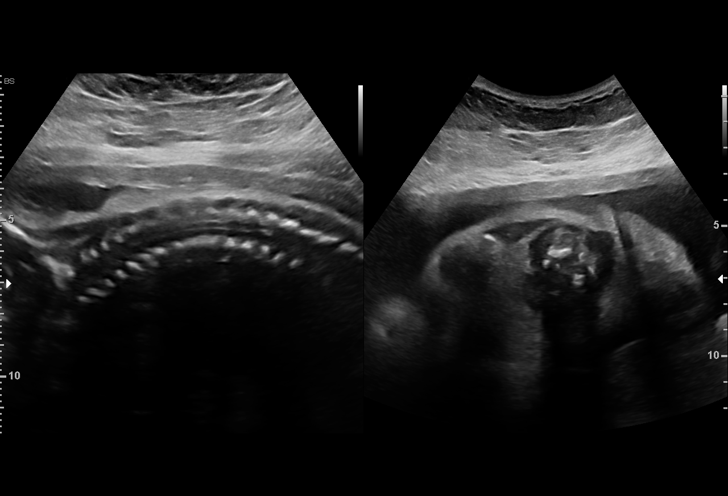
[im 63/121]
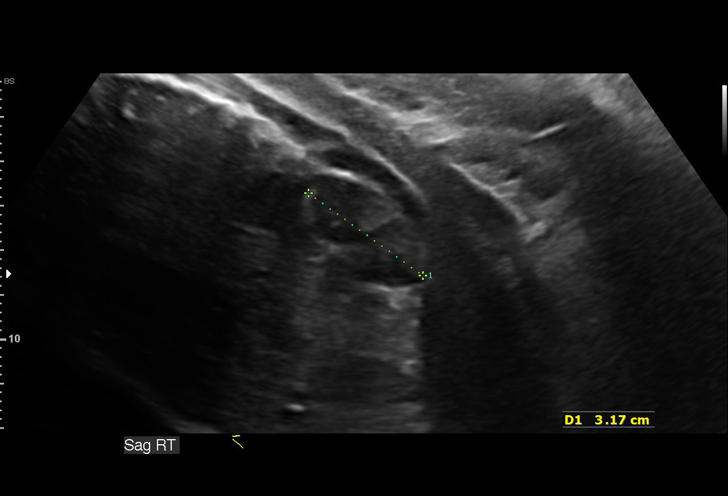
[im 72/121]
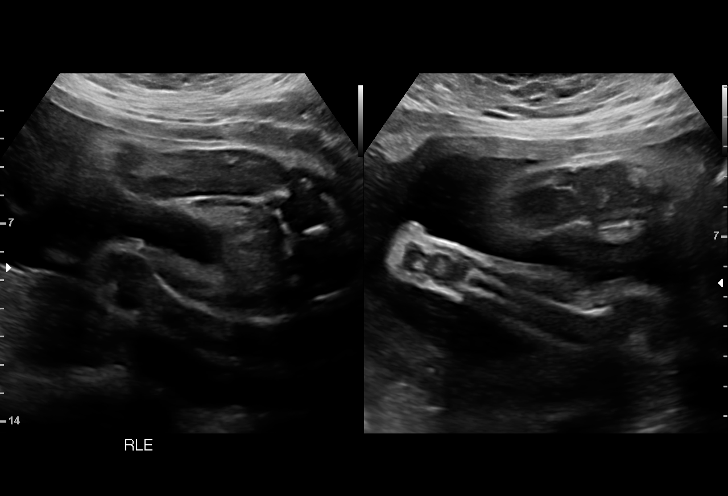
[im 81/121]
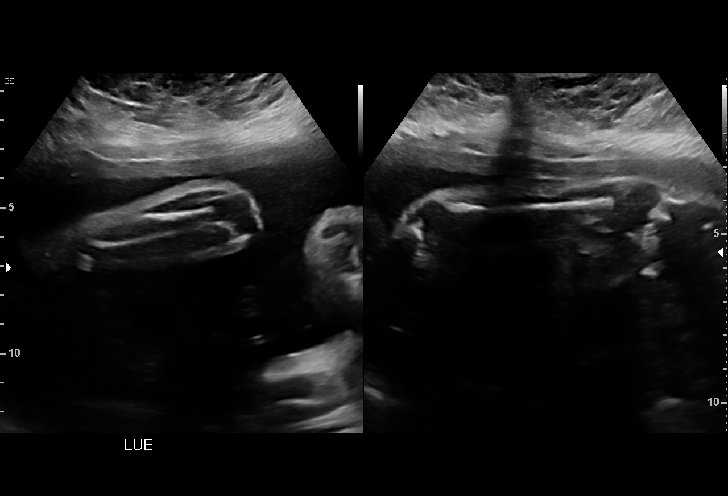
[im 89/121]
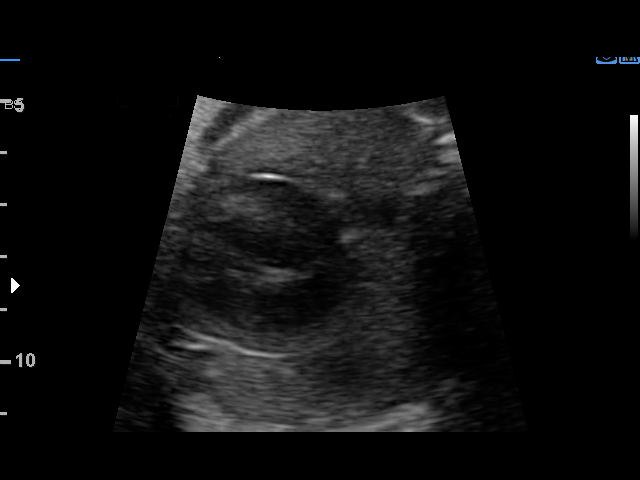
[im 98/121]
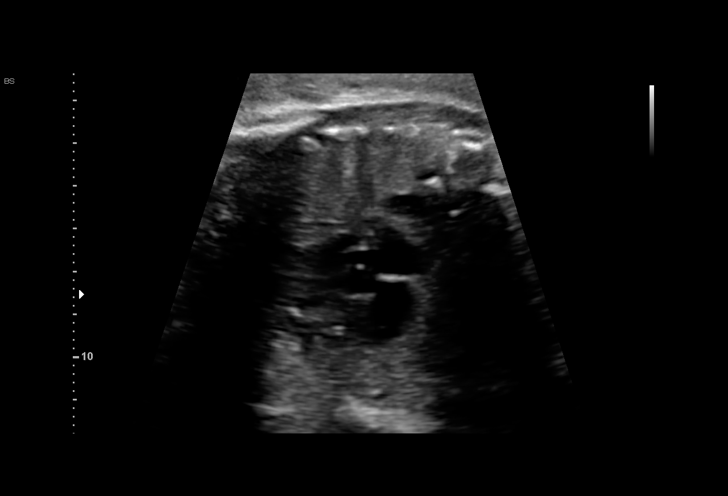
[im 107/121]
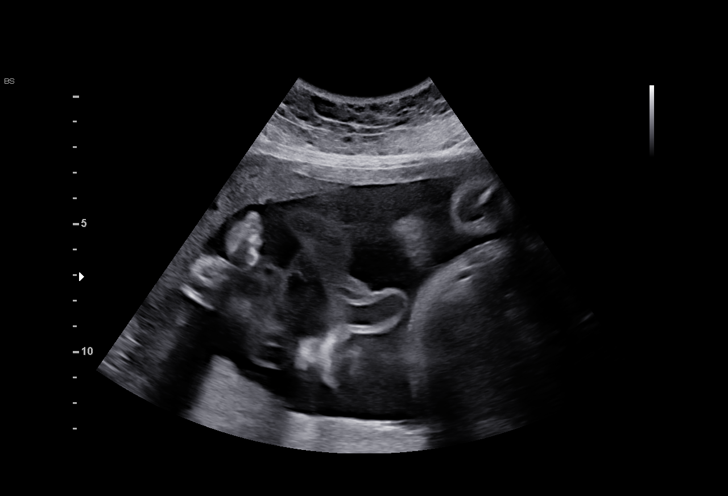
[im 116/121]
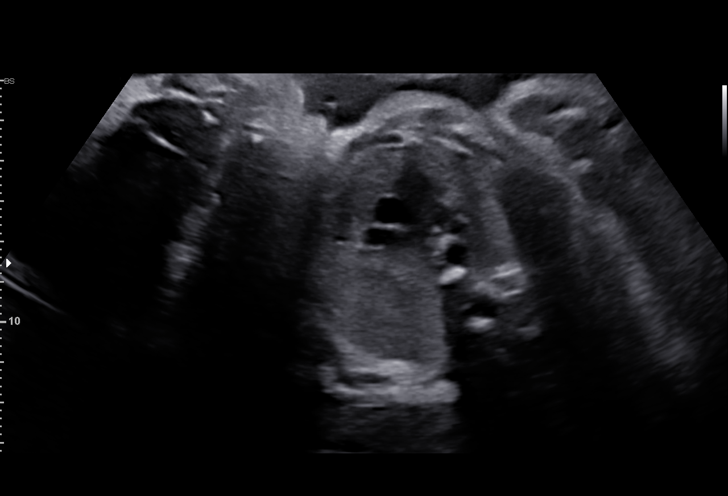

[13 of 28 positions shown; findings below may reference images not displayed]

Health Department
                   Jhemboy NP

                                                      JURE MILKA

Indications

 Advanced maternal age multigravida 35+,
 third trimester
 Medical complication of pregnancy (Covid-
 19)
 29 weeks gestation of pregnancy
 Encounter for antenatal screening for
 malformations
 Gestational diabetes in pregnancy,
 unspecified control
Fetal Evaluation

 Num Of Fetuses:         1
 Fetal Heart Rate(bpm):  136
 Cardiac Activity:       Observed
 Presentation:           Breech
 Placenta:               Posterior
 P. Cord Insertion:      Visualized, central

 Amniotic Fluid
 AFI FV:      Within normal limits

 AFI Sum(cm)     %Tile       Largest Pocket(cm)
 21.1            84

 RUQ(cm)       RLQ(cm)       LUQ(cm)        LLQ(cm)

Biometry

 BPD:      74.5  mm     G. Age:  29w 6d         57  %    CI:        72.23   %    70 - 86
                                                         FL/HC:      19.4   %    19.6 -
 HC:      278.9  mm     G. Age:  30w 4d         53  %    HC/AC:      1.00        0.99 -
 AC:      278.4  mm     G. Age:  31w 6d         97  %    FL/BPD:     72.5   %    71 - 87
 FL:         54  mm     G. Age:  28w 4d         18  %    FL/AC:      19.4   %    20 - 24
 HUM:      51.6  mm     G. Age:  30w 1d         64  %
 CER:      32.8  mm     G. Age:  28w 0d         15  %
 LV:        3.3  mm
 CM:        7.5  mm

 Est. FW:    7147  gm      3 lb 8 oz     83  %
OB History

 Gravidity:    4
 Living:       3
Gestational Age

 LMP:           28w 2d        Date:  01/03/20                 EDD:   10/09/20
 Clinical EDD:  29w 2d                                        EDD:   10/02/20
 U/S Today:     30w 2d                                        EDD:   09/25/20
 Best:          29w 2d     Det. By:  Clinical EDD             EDD:   10/02/20
Anatomy

 Cranium:               Appears normal         LVOT:                   Not well visualized
 Cavum:                 Appears normal         Aortic Arch:            Appears normal
 Ventricles:            Appears normal         Ductal Arch:            Not well visualized
 Choroid Plexus:        Not well visualized    Diaphragm:              Appears normal
 Cerebellum:            Appears normal         Stomach:                Appears normal, left
                                                                       sided
 Posterior Fossa:       Appears normal         Abdomen:                Appears normal
 Nuchal Fold:           Not applicable (>20    Abdominal Wall:         Not well visualized
                        wks GA)
 Face:                  Orbits nl; profile not Cord Vessels:           Appears normal (3
                        well visualized                                vessel cord)
 Lips:                  Appears normal         Kidneys:                Appear normal
 Palate:                Not well visualized    Bladder:                Appears normal
 Thoracic:              Appears normal         Spine:                  Ltd views no
                                                                       intracranial signs of
                                                                       NTD
 Heart:                 Not well visualized    Upper Extremities:      Appears normal
 RVOT:                  Not well visualized    Lower Extremities:      Appears normal

 Other:  Hands and feet visualized. Fetus appears to be a male. Technically
         difficult due to maternal habitus and fetal position.
Cervix Uterus Adnexa

 Cervix
 Not visualized (advanced GA >94wks)
 Uterus
 No abnormality visualized.

 Right Ovary
 Not visualized.

 Left Ovary
 Not visualized.

 Cul De Sac
 No free fluid seen.

 Adnexa
 No adnexal mass visualized.
Comments

 This patient was seen for a detailed fetal anatomy scan due
 to maternal obesity and advanced maternal age. She recently
 transferred her care from the [facility] [REDACTED].  She reports that she was recently diagnosed
 with diet-controlled gestational diabetes.
 She denies any other significant past medical history.
 She has declined all screening tests for fetal aneuploidy in
 her current pregnancy.
 She was informed that the fetal growth and amniotic fluid
 level were appropriate for her gestational age.  She reports
 that her EDC was recently changed to October 02, 2020.
 The views of the fetal anatomy were limited today due to her
 advanced gestational age and the fetal position.
 The patient was informed that anomalies may be missed due
 to technical limitations. If the fetus is in a suboptimal position
 or maternal habitus is increased, visualization of the fetus in
 the maternal uterus may be impaired.
 A follow-up exam was scheduled in 4 weeks to assess the
 fetal growth and to try to complete the views of the fetal
 anatomy.

 Weekly fetal testing should be started at around 32 weeks
 should she require insulin or Metformin for treatment of her
 diabetes.

 All conversations were held with the patient today with the
 help of a Spanish interpreter.

## 2022-07-01 ENCOUNTER — Other Ambulatory Visit (HOSPITAL_BASED_OUTPATIENT_CLINIC_OR_DEPARTMENT_OTHER): Payer: Self-pay

## 2022-07-03 ENCOUNTER — Other Ambulatory Visit: Payer: Self-pay

## 2022-07-03 ENCOUNTER — Encounter (HOSPITAL_COMMUNITY): Payer: Self-pay

## 2022-07-03 ENCOUNTER — Emergency Department (HOSPITAL_COMMUNITY)
Admission: EM | Admit: 2022-07-03 | Discharge: 2022-07-04 | Disposition: A | Discharge status: Home / Self Care | Payer: Self-pay | Attending: Emergency Medicine | Admitting: Emergency Medicine

## 2022-07-03 DIAGNOSIS — R739 Hyperglycemia, unspecified: Secondary | ICD-10-CM

## 2022-07-03 DIAGNOSIS — E139 Other specified diabetes mellitus without complications: Secondary | ICD-10-CM

## 2022-07-03 DIAGNOSIS — Z8616 Personal history of COVID-19: Secondary | ICD-10-CM | POA: Insufficient documentation

## 2022-07-03 DIAGNOSIS — F17200 Nicotine dependence, unspecified, uncomplicated: Secondary | ICD-10-CM | POA: Insufficient documentation

## 2022-07-03 DIAGNOSIS — E1365 Other specified diabetes mellitus with hyperglycemia: Secondary | ICD-10-CM | POA: Insufficient documentation

## 2022-07-03 LAB — CBC WITH DIFFERENTIAL/PLATELET
Abs Immature Granulocytes: 0 10*3/uL (ref 0.00–0.07)
Basophils Absolute: 0 10*3/uL (ref 0.0–0.1)
Basophils Relative: 1 %
Eosinophils Absolute: 0.1 10*3/uL (ref 0.0–0.5)
Eosinophils Relative: 2 %
HCT: 38.2 % (ref 36.0–46.0)
Hemoglobin: 13.3 g/dL (ref 12.0–15.0)
Immature Granulocytes: 0 %
Lymphocytes Relative: 42 %
Lymphs Abs: 2 10*3/uL (ref 0.7–4.0)
MCH: 30.6 pg (ref 26.0–34.0)
MCHC: 34.8 g/dL (ref 30.0–36.0)
MCV: 88 fL (ref 80.0–100.0)
Monocytes Absolute: 0.3 10*3/uL (ref 0.1–1.0)
Monocytes Relative: 7 %
Neutro Abs: 2.4 10*3/uL (ref 1.7–7.7)
Neutrophils Relative %: 48 %
Platelets: 309 10*3/uL (ref 150–400)
RBC: 4.34 MIL/uL (ref 3.87–5.11)
RDW: 12.4 % (ref 11.5–15.5)
WBC: 4.8 10*3/uL (ref 4.0–10.5)
nRBC: 0 % (ref 0.0–0.2)

## 2022-07-03 LAB — URINALYSIS, ROUTINE W REFLEX MICROSCOPIC
Bilirubin Urine: NEGATIVE
Glucose, UA: >=500 mg/dL — AB
Ketones, ur: NEGATIVE mg/dL
Leukocytes,Ua: NEGATIVE
Nitrite: NEGATIVE
Protein, ur: NEGATIVE mg/dL
RBC / HPF: >50 RBC/hpf — ABNORMAL HIGH (ref 0–5)
Specific Gravity, Urine: 1.027 (ref 1.005–1.030)
pH: 6 (ref 5.0–8.0)

## 2022-07-03 LAB — COMPREHENSIVE METABOLIC PANEL
ALT: 29 U/L (ref 0–44)
AST: 27 U/L (ref 15–41)
Albumin: 4.2 g/dL (ref 3.5–5.0)
Alkaline Phosphatase: 101 U/L (ref 38–126)
Anion gap: 10 (ref 5–15)
BUN: 11 mg/dL (ref 6–20)
CO2: 23 mmol/L (ref 22–32)
Calcium: 9.1 mg/dL (ref 8.9–10.3)
Chloride: 96 mmol/L — ABNORMAL LOW (ref 98–111)
Creatinine, Ser: 0.55 mg/dL (ref 0.44–1.00)
GFR, Estimated: >60 mL/min (ref >60)
Glucose, Bld: 556 mg/dL (ref 70–99)
Potassium: 4.1 mmol/L (ref 3.5–5.1)
Sodium: 129 mmol/L — ABNORMAL LOW (ref 135–145)
Total Bilirubin: 0.5 mg/dL (ref 0.3–1.2)
Total Protein: 7.4 g/dL (ref 6.5–8.1)

## 2022-07-03 LAB — BETA-HYDROXYBUTYRIC ACID: Beta-Hydroxybutyric Acid: 0.2 mmol/L (ref 0.05–0.27)

## 2022-07-03 LAB — CBG MONITORING, ED
Glucose-Capillary: 365 mg/dL — ABNORMAL HIGH (ref 70–99)
Glucose-Capillary: 576 mg/dL (ref 70–99)

## 2022-07-03 MED ORDER — SODIUM CHLORIDE 0.9 % IV BOLUS
1000.0000 mL | Freq: Once | INTRAVENOUS | Status: AC
  Administered 2022-07-03: 1000 mL via INTRAVENOUS

## 2022-07-03 MED ORDER — INSULIN ASPART PROT & ASPART (70-30 MIX) 100 UNIT/ML ~~LOC~~ SUSP
10.0000 [IU] | Freq: Once | SUBCUTANEOUS | Status: AC
  Administered 2022-07-03: 10 [IU] via SUBCUTANEOUS
  Filled 2022-07-03: qty 10

## 2022-07-03 NOTE — ED Notes (Signed)
Pt ambulatory to bathroom w/o assist

## 2022-07-03 NOTE — ED Triage Notes (Signed)
Pt went to see her primary today and was told to come here for an A1C over 14. Pt reports having diabetes during her pregnancy and was on insulin but currently is not.

## 2022-07-03 NOTE — ED Notes (Signed)
Labeled specimen cup given to pt for U/A collection per MD order. ENMiles 

## 2022-07-03 NOTE — ED Provider Triage Note (Signed)
Emergency Medicine Provider Triage Evaluation Note  Sara Fowler , a 37 y.o. female  was evaluated in triage.  Pt complains of has history of diabetes, states she had gestational diabetes and sugars remained elevated.  She was on insulin when she was pregnant but has been on no medication since then.  Went to primary care today and they did blood work and advised she come to the ED for very high blood sugar levels.  She has paperwork with her that shows her hemoglobin A1c was 14.9.  She denies any physical complaints.  Translator service was used.  Review of Systems  Positive: Blood sugar Negative: Dental pain vomiting fevers or chills  Physical Exam  BP 125/84 (BP Location: Right Arm)   Pulse 75   Temp 98 F (36.7 C) (Oral)   Resp 18   SpO2 99%  Gen:   Awake, no distress   Resp:  Normal effort  MSK:   Moves extremities without difficulty  Other:    Medical Decision Making  Medically screening exam initiated at 8:08 PM.  Appropriate orders placed.  Sara Fowler was informed that the remainder of the evaluation will be completed by another provider, this initial triage assessment does not replace that evaluation, and the importance of remaining in the ED until their evaluation is complete.     Gwenevere Abbot, [place] 07/03/22 2009

## 2022-07-04 LAB — OSMOLALITY: Osmolality: 311 mOsm/kg — ABNORMAL HIGH (ref 275–295)

## 2022-07-04 MED ORDER — METFORMIN HCL 500 MG PO TABS: 500.0000 mg | ORAL_TABLET | Freq: Two times a day (BID) | ORAL | 1 refills | Status: AC

## 2022-07-04 MED ORDER — METFORMIN HCL ER 500 MG PO TB24: 500.0000 mg | ORAL_TABLET | Freq: Every day | ORAL | 1 refills | Status: DC

## 2022-07-04 NOTE — Discharge Instructions (Addendum)
It was a pleasure caring for you today in the emergency department.  Please return to the emergency department for any worsening or worrisome symptoms.  Please follow-up with primary care doctor in the next 3 to 5 days for further diabetes management.   Fue un placer atenderle hoy en el departamento de emergencias.  Regrese al departamento de emergencias si cualquier sntoma que empeore o sea preocupante.  Haga un seguimiento con su mdico de atencin [employer] prximos 3 a 5 das para un mayor control de la diabetes.

## 2022-07-05 ENCOUNTER — Encounter (HOSPITAL_COMMUNITY): Payer: Self-pay | Admitting: Emergency Medicine

## 2022-07-05 NOTE — ED Provider Notes (Signed)
[place] EMERGENCY DEPARTMENT AT [facility] Provider Note  CSN: [id number] Arrival date & time: 07/03/22 1918  Chief Complaint(s) Hyperglycemia  HPI Sara Fowler is a 37 y.o. female with past medical history as below, significant for spanish speaking, tubal, gestational diabetes who presents to the ED with complaint of elevated glucose. Pt reports she has not seen PCP in >12 mos, she has had gradually blurry vision over the past year, fatigue, increased thirst, tingling in her feet, weight changes. Was seen at health dept earlier and told she had high sugar and had A1c >14. Was told she needed a medication to reduce her sugar. She has no n/v , no diarrhea, no cp or dib, no fevers or chills, no urination changes but does have increased thirst   Translator offered, pt prefers to have daughter at bedside translate for her.   Past Medical History Past Medical History:  Diagnosis Date   Abscess of breast    Right   COVID-19    Gestational diabetes    Malpositioned IUD 12/2016   Stress headaches    Patient Active Problem List   Diagnosis Date Noted   History of bilateral tubal ligation 09/26/2020   Language barrier 08/14/2020   History of gestational diabetes 07/12/2020   Home Medication(s) Prior to Admission medications   Medication Sig Start Date End Date Taking? Authorizing Provider  acetaminophen (TYLENOL) 500 MG tablet Take 500 mg by mouth every 6 (six) hours as needed for mild pain or moderate pain.   Yes [provider]  metFORMIN (GLUCOPHAGE) 500 MG tablet Take 1 tablet (500 mg total) by mouth 2 (two) times daily with a meal. 07/04/22 09/02/22 Yes Tanda Rockers A, DO  ibuprofen (ADVIL) 800 MG tablet Take 1 tablet (800 mg total) by mouth every 8 (eight) hours as needed. Patient not taking: Reported on 07/03/2022 11/13/20   Gita Kudo, MD                                                                                                                                     Past Surgical History Past Surgical History:  Procedure Laterality Date   CESAREAN SECTION WITH BILATERAL TUBAL LIGATION N/A 09/26/2020   Procedure: CESAREAN SECTION WITH BILATERAL TUBAL LIGATION;  Surgeon: Federico Flake, MD;  Location: [facility];  Service: Obstetrics;  Laterality: N/A;   HYSTEROSCOPY WITH D & C N/A 01/14/2017   Procedure: DILATATION AND CURETTAGE /HYSTEROSCOPY W/ IUD REMOVAL;  Surgeon: Hermina Staggers, MD;  Location: [facility];  Service: Gynecology;  Laterality: N/A;   NO PAST SURGERIES     Family History Family History  Problem Relation Age of Onset   Asthma Daughter     Social History Social History   Tobacco Use   Smoking status: Never   Smokeless tobacco: Never  Vaping Use   Vaping Use: Never used  Substance Use Topics  Alcohol use: Not Currently    Comment: occasionally   Drug use: Never   Allergies Patient has no known allergies.  Review of Systems Review of Systems  Constitutional:  Positive for fatigue. Negative for chills and fever.  HENT:  Negative for facial swelling and trouble swallowing.   Eyes:  Positive for visual disturbance. Negative for photophobia.  Respiratory:  Negative for cough and shortness of breath.   Cardiovascular:  Negative for chest pain and palpitations.  Gastrointestinal:  Negative for abdominal pain, nausea and vomiting.  Endocrine: Positive for polydipsia. Negative for polyuria.  Genitourinary:  Negative for difficulty urinating and hematuria.  Musculoskeletal:  Negative for gait problem and joint swelling.  Skin:  Negative for pallor and rash.  Neurological:  Negative for syncope and headaches.       Tingling to feet  Psychiatric/Behavioral:  Negative for agitation and confusion.     Physical Exam Vital Signs  I have reviewed the triage vital signs BP 120/75   Pulse 75   Temp 98 F (36.7 C)   Resp 17   Ht 5\' 2"  (1.575 m)   Wt 100.2 kg   SpO2 96%   BMI 40.40 kg/m   Physical Exam Vitals and nursing note reviewed.  Constitutional:      General: She is not in acute distress.    Appearance: Normal appearance.  HENT:     Head: Normocephalic and atraumatic.     Right Ear: External ear normal.     Left Ear: External ear normal.     Nose: Nose normal.     Mouth/Throat:     Mouth: Mucous membranes are moist.  Eyes:     General: No scleral icterus.       Right eye: No discharge.        Left eye: No discharge.     Extraocular Movements: Extraocular movements intact.     Pupils: Pupils are equal, round, and reactive to light.  Cardiovascular:     Rate and Rhythm: Normal rate and regular rhythm.     Pulses: Normal pulses.     Heart sounds: Normal heart sounds.  Pulmonary:     Effort: Pulmonary effort is normal. No respiratory distress.     Breath sounds: Normal breath sounds.  Abdominal:     General: Abdomen is flat.     Palpations: Abdomen is soft.     Tenderness: There is no abdominal tenderness.  Musculoskeletal:        General: Normal range of motion.     Cervical back: Normal range of motion.     Right lower leg: No edema.     Left lower leg: No edema.  Skin:    General: Skin is warm and dry.     Capillary Refill: Capillary refill takes less than 2 seconds.  Neurological:     Mental Status: She is alert and oriented to person, place, and time.     GCS: GCS eye subscore is 4. GCS verbal subscore is 5. GCS motor subscore is 6.     Cranial Nerves: Cranial nerves 2-12 are intact.     Sensory: Sensation is intact.     Motor: Motor function is intact.     Coordination: Coordination is intact.     Gait: Gait is intact.  Psychiatric:        Mood and Affect: Mood normal.        Behavior: Behavior normal.     ED Results and Treatments Labs (all labs  ordered are listed, but only abnormal results are displayed) Labs Reviewed  COMPREHENSIVE METABOLIC PANEL - Abnormal; Notable for the following components:      Result Value   Sodium 129 (*)     Chloride 96 (*)    Glucose, Bld 556 (*)    All other components within normal limits  URINALYSIS, ROUTINE W REFLEX MICROSCOPIC - Abnormal; Notable for the following components:   Color, Urine COLORLESS (*)    Glucose, UA >=500 (*)    Hgb urine dipstick LARGE (*)    RBC / HPF >50 (*)    Bacteria, UA RARE (*)    All other components within normal limits  OSMOLALITY - Abnormal; Notable for the following components:   Osmolality 311 (*)    All other components within normal limits  CBG MONITORING, ED - Abnormal; Notable for the following components:   Glucose-Capillary 576 (*)    All other components within normal limits  CBG MONITORING, ED - Abnormal; Notable for the following components:   Glucose-Capillary 365 (*)    All other components within normal limits  CBC WITH DIFFERENTIAL/PLATELET  BETA-HYDROXYBUTYRIC ACID                                                                                                                          Radiology No results found.  Pertinent labs & imaging results that were available during my care of the patient were reviewed by me and considered in my medical decision making (see MDM for details).  Medications Ordered in ED Medications  sodium chloride 0.9 % bolus 1,000 mL (0 mLs Intravenous Stopped 07/03/22 2304)  insulin aspart protamine- aspart (NOVOLOG MIX 70/30) injection 10 Units (10 Units Subcutaneous Given 07/03/22 2304)                                                                                                                                     Procedures Procedures  (including critical care time)  Medical Decision Making / ED Course   MDM:  Sara Fowler is a 37 y.o. female with past medical history as below, significant for spanish speaking, tubal, gestational diabetes who presents to the ED with complaint of elevated glucose. . The complaint involves an extensive differential diagnosis and also carries with it a  high risk of complications and morbidity.  Serious etiology was considered. Ddx includes but is not limited to:  dka, hhs, hyperglycemia, etc  On initial assessment the patient is: resting comfortably, neuro not focal, exam benign Vital signs and nursing notes were reviewed    She has hyperglycemia, no AG, neg ketones on UA. She does not have DKA or HHS. Osm is elev. Labs o/w stable. Given novolog 10 units and IVF, glucose improved. She is feeling better. Tolerating PO w/o difficulty. Discussed diabetic diet, start metformin, f/u with PCP in 3 days for recheck. Given pcp options locally.  RTED if worse  Blood noted in urine, no abd pain, she is menstruating   Concern for DM  The patient improved significantly and was discharged in stable condition. Detailed discussions were had with the patient regarding current findings, and need for close f/u with PCP or on call doctor. The patient has been instructed to return immediately if the symptoms worsen in any way for re-evaluation. Patient verbalized understanding and is in agreement with current care plan. All questions answered prior to discharge.   Translator offered, pt prefers to have daughter at bedside translate for her.      Additional history obtained: -Additional history obtained from family -External records from outside source obtained and reviewed including: Chart review including previous notes, labs, imaging, consultation notes including prior ed visits, prior labs/imaging   Lab Tests: -I ordered, reviewed, and interpreted labs.   The pertinent results include:   Labs Reviewed  COMPREHENSIVE METABOLIC PANEL - Abnormal; Notable for the following components:      Result Value   Sodium 129 (*)    Chloride 96 (*)    Glucose, Bld 556 (*)    All other components within normal limits  URINALYSIS, ROUTINE W REFLEX MICROSCOPIC - Abnormal; Notable for the following components:   Color, Urine COLORLESS (*)    Glucose, UA >=500 (*)     Hgb urine dipstick LARGE (*)    RBC / HPF >50 (*)    Bacteria, UA RARE (*)    All other components within normal limits  OSMOLALITY - Abnormal; Notable for the following components:   Osmolality 311 (*)    All other components within normal limits  CBG MONITORING, ED - Abnormal; Notable for the following components:   Glucose-Capillary 576 (*)    All other components within normal limits  CBG MONITORING, ED - Abnormal; Notable for the following components:   Glucose-Capillary 365 (*)    All other components within normal limits  CBC WITH DIFFERENTIAL/PLATELET  BETA-HYDROXYBUTYRIC ACID    Notable for as above, hyper glycemia, no dka  EKG   EKG Interpretation  Date/Time:    Ventricular Rate:    PR Interval:    QRS Duration:   QT Interval:    QTC Calculation:   R Axis:     Text Interpretation:           Imaging Studies ordered: na   Medicines ordered and prescription drug management: Meds ordered this encounter  Medications   sodium chloride 0.9 % bolus 1,000 mL   insulin aspart protamine- aspart (NOVOLOG MIX 70/30) injection 10 Units   DISCONTD: metFORMIN (GLUCOPHAGE-XR) 500 MG 24 hr tablet    Sig: Take 1 tablet (500 mg total) by mouth daily with breakfast.    Dispense:  30 tablet    Refill:  1   metFORMIN (GLUCOPHAGE) 500 MG tablet    Sig: Take 1 tablet (500 mg total) by mouth 2 (two) times daily with a meal.    Dispense:  60 tablet  Refill:  1    -I have reviewed the patients home medicines and have made adjustments as needed   Consultations Obtained: na   Cardiac Monitoring: The patient was maintained on a cardiac monitor.  I personally viewed and interpreted the cardiac monitored which showed an underlying rhythm of: nsr  Social Determinants of Health:  Diagnosis or treatment significantly limited by social determinants of health: current smoker and uninsured   Reevaluation: After the interventions noted above, I reevaluated the patient and  found that they have improved  Co morbidities that complicate the patient evaluation  Past Medical History:  Diagnosis Date   Abscess of breast    Right   COVID-19    Gestational diabetes    Malpositioned IUD 12/2016   Stress headaches       Dispostion: Disposition decision including need for hospitalization was considered, and patient discharged from emergency department.    Final Clinical Impression(s) / ED Diagnoses Final diagnoses:  Other specified diabetes mellitus without complication, without long-term current use of insulin (HCC)  Hyperglycemia     This chart was dictated using voice recognition software.  Despite best efforts to proofread,  errors can occur which can change the documentation meaning.    Sloan Leiter, DO 07/05/22 2141
